# Patient Record
Sex: Female | Born: 1960 | Race: Black or African American | Hispanic: No | Smoking: Never smoker
Health system: Southern US, Community
[De-identification: ages and names within clinical notes are randomized; demographics above are authoritative.]

## PROBLEM LIST (undated history)

## (undated) DIAGNOSIS — E785 Hyperlipidemia, unspecified: Secondary | ICD-10-CM

## (undated) DIAGNOSIS — J45909 Unspecified asthma, uncomplicated: Secondary | ICD-10-CM

## (undated) DIAGNOSIS — J449 Chronic obstructive pulmonary disease, unspecified: Secondary | ICD-10-CM

## (undated) DIAGNOSIS — I10 Essential (primary) hypertension: Secondary | ICD-10-CM

## (undated) DIAGNOSIS — F32A Depression, unspecified: Secondary | ICD-10-CM

## (undated) HISTORY — PX: BLADDER SURGERY: SHX569

## (undated) HISTORY — DX: Unspecified asthma, uncomplicated: J45.909

## (undated) HISTORY — PX: ABDOMINAL HYSTERECTOMY: SHX81

---

## 2001-01-14 ENCOUNTER — Inpatient Hospital Stay (HOSPITAL_COMMUNITY): Admission: AD | Admit: 2001-01-14 | Discharge: 2001-01-14 | Payer: Self-pay | Admitting: *Deleted

## 2009-05-07 ENCOUNTER — Emergency Department (HOSPITAL_COMMUNITY): Admission: EM | Admit: 2009-05-07 | Discharge: 2009-05-08 | Payer: Self-pay | Admitting: Emergency Medicine

## 2010-06-24 LAB — URINALYSIS, ROUTINE W REFLEX MICROSCOPIC
Bilirubin Urine: NEGATIVE
Hgb urine dipstick: NEGATIVE
Nitrite: NEGATIVE
Protein, ur: NEGATIVE mg/dL
Specific Gravity, Urine: 1.029 (ref 1.005–1.030)
Urobilinogen, UA: 0.2 mg/dL (ref 0.0–1.0)

## 2010-06-24 LAB — GC/CHLAMYDIA PROBE AMP, GENITAL
Chlamydia, DNA Probe: NEGATIVE
GC Probe Amp, Genital: NEGATIVE

## 2010-06-24 LAB — WET PREP, GENITAL: Trich, Wet Prep: NONE SEEN

## 2010-06-24 LAB — RPR: RPR Ser Ql: NONREACTIVE

## 2013-12-31 ENCOUNTER — Emergency Department (HOSPITAL_COMMUNITY)
Admission: EM | Admit: 2013-12-31 | Discharge: 2013-12-31 | Disposition: A | Discharge status: Home / Self Care | Payer: Self-pay | Attending: Emergency Medicine | Admitting: Emergency Medicine

## 2013-12-31 ENCOUNTER — Encounter (HOSPITAL_COMMUNITY): Payer: Self-pay | Admitting: Emergency Medicine

## 2013-12-31 DIAGNOSIS — Z791 Long term (current) use of non-steroidal anti-inflammatories (NSAID): Secondary | ICD-10-CM | POA: Insufficient documentation

## 2013-12-31 DIAGNOSIS — M62838 Other muscle spasm: Secondary | ICD-10-CM | POA: Insufficient documentation

## 2013-12-31 DIAGNOSIS — M25519 Pain in unspecified shoulder: Secondary | ICD-10-CM | POA: Insufficient documentation

## 2013-12-31 DIAGNOSIS — I10 Essential (primary) hypertension: Secondary | ICD-10-CM | POA: Insufficient documentation

## 2013-12-31 DIAGNOSIS — Z79899 Other long term (current) drug therapy: Secondary | ICD-10-CM | POA: Insufficient documentation

## 2013-12-31 HISTORY — DX: Essential (primary) hypertension: I10

## 2013-12-31 MED ORDER — DIAZEPAM 5 MG PO TABS: 5.0000 mg | ORAL_TABLET | Freq: Three times a day (TID) | ORAL | Status: AC | PRN

## 2013-12-31 MED ORDER — NAPROXEN 500 MG PO TABS: 500.0000 mg | ORAL_TABLET | Freq: Two times a day (BID) | ORAL | Status: DC

## 2013-12-31 MED ORDER — DIAZEPAM 5 MG PO TABS
5.0000 mg | ORAL_TABLET | Freq: Once | ORAL | Status: AC
  Administered 2013-12-31: 5 mg via ORAL
  Filled 2013-12-31: qty 1

## 2013-12-31 MED ORDER — NAPROXEN 250 MG PO TABS
500.0000 mg | ORAL_TABLET | Freq: Once | ORAL | Status: AC
  Administered 2013-12-31: 500 mg via ORAL
  Filled 2013-12-31: qty 2
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
## 2013-12-31 NOTE — Discharge Instructions (Signed)
Please follow up with your primary care physician in 1-2 days. If you do not have one please call the [facility] number listed above. Please take pain medication and/or muscle relaxants as prescribed and as needed for pain. Please do not drive on narcotic pain medication or on muscle relaxants. Please follow RICE method normal. Please read all discharge instructions and return precautions.   Shoulder Pain The shoulder is the joint that connects your arms to your body. The bones that form the shoulder joint include the upper arm bone (humerus), the shoulder blade (scapula), and the collarbone (clavicle). The top of the humerus is shaped like a ball and fits into a rather flat socket on the scapula (glenoid cavity). A combination of muscles and strong, fibrous tissues that connect muscles to bones (tendons) support your shoulder joint and hold the ball in the socket. Small, fluid-filled sacs (bursae) are located in different areas of the joint. They act as cushions between the bones and the overlying soft tissues and help reduce friction between the gliding tendons and the bone as you move your arm. Your shoulder joint allows a wide range of motion in your arm. This range of motion allows you to do things like scratch your back or throw a ball. However, this range of motion also makes your shoulder more prone to pain from overuse and injury. Causes of shoulder pain can originate from both injury and overuse and usually can be grouped in the following four categories:  Redness, swelling, and pain (inflammation) of the tendon (tendinitis) or the bursae (bursitis).  Instability, such as a dislocation of the joint.  Inflammation of the joint (arthritis).  Broken bone (fracture). HOME CARE INSTRUCTIONS   Apply ice to the sore area.  Put ice in a plastic bag.  Place a towel between your skin and the bag.  Leave the ice on for 15-20 minutes, 3-4 times per day for the first 2 days, or  as directed by your health care provider.  Stop using cold packs if they do not help with the pain.  If you have a shoulder sling or immobilizer, wear it as long as your caregiver instructs. Only remove it to shower or bathe. Move your arm as little as possible, but keep your hand moving to prevent swelling.  Squeeze a soft ball or foam pad as much as possible to help prevent swelling.  Only take over-the-counter or prescription medicines for pain, discomfort, or fever as directed by your caregiver. SEEK MEDICAL CARE IF:   Your shoulder pain increases, or new pain develops in your arm, hand, or fingers.  Your hand or fingers become cold and numb.  Your pain is not relieved with medicines. SEEK IMMEDIATE MEDICAL CARE IF:   Your arm, hand, or fingers are numb or tingling.  Your arm, hand, or fingers are significantly swollen or turn white or blue. MAKE SURE YOU:   Understand these instructions.  Will watch your condition.  Will get help right away if you are not doing well or get worse. Document Released: 12/29/2004 Document Revised: 08/05/2013 Document Reviewed: 03/05/2011 [facility] Patient Information 2015 [place], [place]. This information is not intended to replace advice given to you by your health care provider. Make sure you discuss any questions you have with your health care provider.  RICE: Routine Care for Injuries The routine care of many injuries includes Rest, Ice, Compression, and Elevation (RICE). HOME CARE INSTRUCTIONS  Rest is needed to allow your body to  heal. Routine activities can usually be resumed when comfortable. Injured tendons and bones can take up to 6 weeks to heal. Tendons are the cord-like structures that attach muscle to bone.  Ice following an injury helps keep the swelling down and reduces pain.  Put ice in a plastic bag.  Place a towel between your skin and the bag.  Leave the ice on for 15-20 minutes, 3-4 times a day, or as directed by your  health care provider. Do this while awake, for the first 24 to 48 hours. After that, continue as directed by your caregiver.  Compression helps keep swelling down. It also gives support and helps with discomfort. If an elastic bandage has been applied, it should be removed and reapplied every 3 to 4 hours. It should not be applied tightly, but firmly enough to keep swelling down. Watch fingers or toes for swelling, bluish discoloration, coldness, numbness, or excessive pain. If any of these problems occur, remove the bandage and reapply loosely. Contact your caregiver if these problems continue.  Elevation helps reduce swelling and decreases pain. With extremities, such as the arms, hands, legs, and feet, the injured area should be placed near or above the level of the heart, if possible. SEEK IMMEDIATE MEDICAL CARE IF:  You have persistent pain and swelling.  You develop redness, numbness, or unexpected weakness.  Your symptoms are getting worse rather than improving after several days. These symptoms may indicate that further evaluation or further X-rays are needed. Sometimes, X-rays may not show a small broken bone (fracture) until 1 week or 10 days later. Make a follow-up appointment with your caregiver. Ask when your X-ray results will be ready. Make sure you get your X-ray results. Document Released: 07/03/2000 Document Revised: 03/26/2013 Document Reviewed: 08/20/2010 [facility] Patient Information 2015 [place]. This information is not intended to replace advice given to you by your health care provider. Make sure you discuss any questions you have with your health care provider.
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
## 2014-01-01 NOTE — ED Provider Notes (Signed)
Medical screening examination/treatment/procedure(s) were performed by non-physician practitioner and as supervising physician I was immediately available for consultation/collaboration.    Vida RollerBrian D Asriel Westrup, MD 01/01/14 [phone]

## 2018-02-10 ENCOUNTER — Emergency Department (HOSPITAL_COMMUNITY)
Admission: EM | Admit: 2018-02-10 | Discharge: 2018-02-10 | Disposition: A | Discharge status: Home / Self Care | Payer: 59 | Attending: Emergency Medicine | Admitting: Emergency Medicine

## 2018-02-10 ENCOUNTER — Other Ambulatory Visit: Payer: Self-pay

## 2018-02-10 ENCOUNTER — Encounter (HOSPITAL_COMMUNITY): Payer: Self-pay | Admitting: Emergency Medicine

## 2018-02-10 DIAGNOSIS — B373 Candidiasis of vulva and vagina: Secondary | ICD-10-CM | POA: Insufficient documentation

## 2018-02-10 DIAGNOSIS — B3731 Acute candidiasis of vulva and vagina: Secondary | ICD-10-CM

## 2018-02-10 DIAGNOSIS — Z79899 Other long term (current) drug therapy: Secondary | ICD-10-CM | POA: Insufficient documentation

## 2018-02-10 DIAGNOSIS — N898 Other specified noninflammatory disorders of vagina: Secondary | ICD-10-CM | POA: Diagnosis present

## 2018-02-10 DIAGNOSIS — I10 Essential (primary) hypertension: Secondary | ICD-10-CM | POA: Diagnosis not present

## 2018-02-10 LAB — WET PREP, GENITAL
Sperm: NONE SEEN
Yeast Wet Prep HPF POC: NONE SEEN

## 2018-02-10 MED ORDER — FLUCONAZOLE 150 MG PO TABS: ORAL_TABLET | ORAL | 0 refills | Status: DC

## 2018-02-10 NOTE — ED Notes (Signed)
Patient verbalizes understanding of discharge instructions. Opportunity for questioning and answers were provided. Armband removed by staff, pt discharged from ED ambulatory.   

## 2018-02-10 NOTE — ED Triage Notes (Signed)
Patient c/o vaginal, itching and burning. She was previously seen at an ED in [place] and was diagnosed with an STI. Was given antibiotics, did not finish all of it and she had a beer every night with the antibiotic.

## 2018-02-10 NOTE — ED Provider Notes (Signed)
[facility] EMERGENCY DEPARTMENT Provider Note   CSN: [id number] Arrival date & time: 02/10/18  1645     History   Chief Complaint Chief Complaint  Patient presents with  . Vaginal Discharge    HPI Nicole Heath is a 57 y.o. female.  The history is provided by the patient. No language interpreter was used.  Vaginal Discharge   This is a new problem. The current episode started more than 2 days ago. The problem occurs constantly. The problem has been gradually worsening. The discharge was normal. Associated symptoms include genital itching. She has tried nothing for the symptoms. The treatment provided no relief. Her past medical history is significant for STD.  Pt just finished rx for amoxicillin.  Pt had an injection of antibiotics for a std a week ago.   Past Medical History:  Diagnosis Date  . Hypertension     There are no active problems to display for this patient.   History reviewed. No pertinent surgical history.   OB History   None      Home Medications    Prior to Admission medications   Medication Sig Start Date End Date Taking? Authorizing Provider  diazepam (VALIUM) 5 MG tablet Take 1 tablet (5 mg total) by mouth every 8 (eight) hours as needed for muscle spasms. 12/31/13   Piepenbrink, Victorino Dike, PA-C  fluconazole (DIFLUCAN) 150 MG tablet Take one tablet now. Repeat in 3 days if needed. 02/10/18   Elson Areas, PA-C  metoprolol tartrate (LOPRESSOR) 25 MG tablet Take 25 mg by mouth daily.    [provider]  naproxen (NAPROSYN) 500 MG tablet Take 1 tablet (500 mg total) by mouth 2 (two) times daily with a meal. 12/31/13   Piepenbrink, Victorino Dike, PA-C  triamterene-hydrochlorothiazide (MAXZIDE-25) 37.5-25 MG per tablet Take 1 tablet by mouth daily.    [provider]    Family History History reviewed. No pertinent family history.  Social History Social History   Tobacco Use  . Smoking status: Never Smoker    Substance Use Topics  . Alcohol use: No  . Drug use: No     Allergies   Patient has no known allergies.   Review of Systems Review of Systems  Genitourinary: Positive for vaginal discharge.  All other systems reviewed and are negative.    Physical Exam Updated Vital Signs BP 136/78 (BP Location: Right Arm)   Pulse 72   Temp 97.6 F (36.4 C) (Oral)   Resp 16   Ht 5\' 3"  (1.6 m)   Wt 104.3 kg   SpO2 98%   BMI 40.74 kg/m   Physical Exam  Constitutional: She appears well-developed and well-nourished.  HENT:  Head: Normocephalic and atraumatic.  Eyes: Pupils are equal, round, and reactive to light. Conjunctivae are normal.  Neck: Normal range of motion. Neck supple.  Cardiovascular: Normal rate.  Abdominal: Soft. There is no tenderness.  Genitourinary: Vaginal discharge found.  Genitourinary Comments: Vaginal discharge,  Thick white,  Irritation,  Looks like yeast  Musculoskeletal: Normal range of motion.  Skin: Skin is warm.     ED Treatments / Results  Labs (all labs ordered are listed, but only abnormal results are displayed) Labs Reviewed  WET PREP, GENITAL  GC/CHLAMYDIA PROBE AMP ([place]) NOT AT [facility]    EKG None  Radiology No results found.  Procedures Procedures (including critical care time)  Medications Ordered in ED Medications - No data to display   Initial Impression / Assessment  and Plan / ED Course  I have reviewed the triage vital signs and the nursing notes.  Pertinent labs & imaging results that were available during my care of the patient were reviewed by me and considered in my medical decision making (see chart for details).     MDM  Pt recently on multiple antibiotics.  I suspect pt has a yeast infection   Final Clinical Impressions(s) / ED Diagnoses   Final diagnoses:  Vaginal yeast infection    ED Discharge Orders         Ordered    fluconazole (DIFLUCAN) 150 MG tablet     02/10/18 1725        An After  Visit Summary was printed and given to the patient.    Elson Areas, [place] 02/10/18 1737    Tegeler, Canary Brim, MD 02/10/18 [phone]

## 2018-02-12 LAB — GC/CHLAMYDIA PROBE AMP (~~LOC~~) NOT AT ARMC
CHLAMYDIA, DNA PROBE: NEGATIVE
Neisseria Gonorrhea: NEGATIVE

## 2021-04-15 ENCOUNTER — Emergency Department (HOSPITAL_COMMUNITY): Payer: Medicare Other

## 2021-04-15 ENCOUNTER — Encounter (HOSPITAL_COMMUNITY): Payer: Self-pay | Admitting: Emergency Medicine

## 2021-04-15 ENCOUNTER — Other Ambulatory Visit: Payer: Self-pay

## 2021-04-15 ENCOUNTER — Emergency Department (HOSPITAL_COMMUNITY)
Admission: EM | Admit: 2021-04-15 | Discharge: 2021-04-16 | Disposition: A | Discharge status: Home / Self Care | Payer: Medicare Other | Attending: Emergency Medicine | Admitting: Emergency Medicine

## 2021-04-15 DIAGNOSIS — H6122 Impacted cerumen, left ear: Secondary | ICD-10-CM | POA: Diagnosis not present

## 2021-04-15 DIAGNOSIS — X500XXA Overexertion from strenuous movement or load, initial encounter: Secondary | ICD-10-CM | POA: Insufficient documentation

## 2021-04-15 DIAGNOSIS — S46812A Strain of other muscles, fascia and tendons at shoulder and upper arm level, left arm, initial encounter: Secondary | ICD-10-CM | POA: Insufficient documentation

## 2021-04-15 DIAGNOSIS — S4992XA Unspecified injury of left shoulder and upper arm, initial encounter: Secondary | ICD-10-CM | POA: Diagnosis present

## 2021-04-15 HISTORY — DX: Hyperlipidemia, unspecified: E78.5

## 2021-04-15 HISTORY — DX: Essential (primary) hypertension: I10

## 2021-04-15 HISTORY — DX: Depression, unspecified: F32.A

## 2021-04-15 NOTE — ED Triage Notes (Signed)
Pt arrived POV from home c/o left arm pain x2 weeks. Pt denies any trauma or injury. Pt states the pain does go up into her neck on the left side.

## 2021-04-15 NOTE — ED Provider Triage Note (Signed)
Emergency Medicine Provider Triage Evaluation Note  RICHELLE GLICK , a 61 y.o. female  was evaluated in triage.  Pt complains of left shoulder pain.  She says she is had these pains for about 2 weeks now.  She expresses pain in her lateral side of her left neck.  It radiates all the way down to her left arm.  She does say at times she has intermittent tingling in her arm especially when she lays on it.  This is not constant and is not present at this time of this exam.  She has no motor weakness. She does not really remember any traumatic event.    Review of Systems  Positive:  Negative:   Physical Exam  BP (!) 157/85 (BP Location: Left Arm)    Pulse 76    Temp 98.8 F (37.1 C) (Oral)    Resp 16    Ht 5\' 3"  (1.6 m)    Wt 104.3 kg    SpO2 98%    BMI 40.74 kg/m  Gen:   Awake, no distress   Resp:  Normal effort  MSK:   Moves extremities without difficulty  Other:  No c midline ttp or stepoffs. Reproducible cervical muscular ttp. Full Rom of LUE. Pulses intact. Sensation intact  Medical Decision Making  Medically screening exam initiated at 4:34 PM.  Appropriate orders placed.  ALIJAH HYDE was informed that the remainder of the evaluation will be completed by another provider, this initial triage assessment does not replace that evaluation, and the importance of remaining in the ED until their evaluation is complete.     Frederich Balding, Claudie Leach 04/15/21 1635

## 2021-04-16 DIAGNOSIS — S46812A Strain of other muscles, fascia and tendons at shoulder and upper arm level, left arm, initial encounter: Secondary | ICD-10-CM | POA: Diagnosis not present

## 2021-04-16 MED ORDER — KETOROLAC TROMETHAMINE 15 MG/ML IJ SOLN
15.0000 mg | Freq: Once | INTRAMUSCULAR | Status: AC
  Administered 2021-04-16: 15 mg via INTRAMUSCULAR
  Filled 2021-04-16: qty 1

## 2021-04-16 MED ORDER — OXYCODONE HCL 5 MG PO TABS
5.0000 mg | ORAL_TABLET | Freq: Once | ORAL | Status: AC
Start: 2021-04-16 — End: 2021-04-16
  Administered 2021-04-16: 5 mg via ORAL
  Filled 2021-04-16: qty 1

## 2021-04-16 MED ORDER — ACETAMINOPHEN 500 MG PO TABS
1000.0000 mg | ORAL_TABLET | Freq: Once | ORAL | Status: AC
  Administered 2021-04-16: 1000 mg via ORAL
  Filled 2021-04-16: qty 2

## 2021-04-16 NOTE — ED Notes (Signed)
RN reviewed discharge instructions with pt. Pt verbalize understanding and had no further questions. VSS upon discharge 

## 2021-04-16 NOTE — Discharge Instructions (Signed)
Take 4 over the counter ibuprofen tablets 3 times a day or 2 over-the-counter naproxen tablets twice a day for pain. Also take tylenol 1000mg (2 extra strength) four times a day.   Sling is for your comfort only.  You should take your arm out of the sling at least 4 times a day and perform range of motion exercises.  I have given you information to try and call the sports medicine physician and see if he is able to see you in the office.  Try not to lift anything greater than 10 pounds or do repetitive shoulder motions with the left arm.

## 2021-04-16 NOTE — ED Provider Notes (Signed)
[facility] EMERGENCY DEPARTMENT Provider Note   CSN: [id number] Arrival date & time: 04/15/21  1528     History  Chief Complaint  Patient presents with   Arm Pain    Nicole Heath is a 61 y.o. female.  61 yo F with a chief complaint of left shoulder pain.  This been going on for the better part of 3 weeks.  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  She tells me that she has been lifting a lot of boxes and moving them from one place to another recently.  Denies trauma to the area.  She has been seen in 2 Emergency Department prior to this 1 according to her.  Feels like her pain continues to persist despite therapies at home.  The history is provided by the patient.  Arm Pain This is a new problem. The current episode started yesterday. The problem occurs constantly. The problem has not changed since onset.Pertinent negatives include no chest pain, no headaches and no shortness of breath. Nothing aggravates the symptoms. Nothing relieves the symptoms. She has tried nothing for the symptoms. The treatment provided no relief.      Home Medications Prior to Admission medications   Not on File      Allergies    Patient has no known allergies.    Review of Systems   Review of Systems  Constitutional:  Negative for chills and fever.  HENT:  Positive for ear pain. Negative for congestion and rhinorrhea.   Eyes:  Negative for redness and visual disturbance.  Respiratory:  Negative for shortness of breath and wheezing.   Cardiovascular:  Negative for chest pain and palpitations.  Gastrointestinal:  Negative for nausea and vomiting.  Genitourinary:  Negative for dysuria and urgency.  Musculoskeletal:  Positive for arthralgias. Negative for myalgias.  Skin:  Negative for pallor and wound.  Neurological:  Negative for dizziness and headaches.   Physical Exam Updated Vital Signs BP (!) 163/92 (BP Location: Right Arm)    Pulse 63    Temp  97.8 F (36.6 C) (Oral)    Resp 19    Ht 5\' 3"  (1.6 m)    Wt 104.3 kg    SpO2 97%    BMI 40.74 kg/m  Physical Exam Vitals and nursing note reviewed.  Constitutional:      General: She is not in acute distress.    Appearance: She is well-developed. She is not diaphoretic.  HENT:     Head: Normocephalic and atraumatic.     Ears:     Comments: Left wax impaction. Eyes:     Pupils: Pupils are equal, round, and reactive to light.  Cardiovascular:     Rate and Rhythm: Normal rate and regular rhythm.     Heart sounds: No murmur heard.   No friction rub. No gallop.  Pulmonary:     Effort: Pulmonary effort is normal.     Breath sounds: No wheezing or rales.  Abdominal:     General: There is no distension.     Palpations: Abdomen is soft.     Tenderness: There is no abdominal tenderness.  Musculoskeletal:        General: Tenderness present.     Cervical back: Normal range of motion and neck supple.     Comments: Pain along the left trapezius muscle belly.  She also has some pain intra-articularly in the left shoulder with forward flexion and lateral flexion.  Sits muscles  are weak somewhat diffusely.  Pulse motor and sensation intact distally.  Skin:    General: Skin is warm and dry.  Neurological:     Mental Status: She is alert and oriented to person, place, and time.  Psychiatric:        Behavior: Behavior normal.    ED Results / Procedures / Treatments   Labs (all labs ordered are listed, but only abnormal results are displayed) Labs Reviewed - No data to display  EKG EKG Interpretation  Date/Time:  Thursday April 15 2021 15:51:52 EST Ventricular Rate:  73 PR Interval:  158 QRS Duration: 94 QT Interval:  396 QTC Calculation: 436 R Axis:   24 Text Interpretation: Normal sinus rhythm ST & T wave abnormality, consider inferolateral ischemia Abnormal ECG No previous ECGs available No old tracing to compare Confirmed by Deno Etienne [id number]) on 04/16/2021 1:10:48  AM  Radiology DG Shoulder Left  Result Date: 04/15/2021 CLINICAL DATA:  Left shoulder pain EXAM: LEFT SHOULDER - 2+ VIEW COMPARISON:  None. FINDINGS: No acute fracture or dislocation. Moderate to severe glenohumeral joint osteoarthritis with joint space loss and bulky marginal osteophytes. Mild-moderate AC joint arthropathy. No soft tissue abnormality. IMPRESSION: Moderate to severe glenohumeral joint osteoarthritis. Electronically Signed   By: Davina Poke D.O.   On: 04/15/2021 17:01    Procedures Procedures    Medications Ordered in ED Medications  acetaminophen (TYLENOL) tablet 1,000 mg (1,000 mg Oral Given 04/16/21 0344)  ketorolac (TORADOL) 15 MG/ML injection 15 mg (15 mg Intramuscular Given 04/16/21 0344)  oxyCODONE (Oxy IR/ROXICODONE) immediate release tablet 5 mg (5 mg Oral Given 04/16/21 0344)    ED Course/ Medical Decision Making/ A&P                           Medical Decision Making  61 yo F with a chief complaints of left shoulder pain.  This been an ongoing issue for her.  Sounds like a muscular strain by history.  She does have some weakness to the rotator cuff though may be secondary to pain from trapezius strain.  Has obvious trapezius strain on exam.  Discussed with her about proper shoulder motions.  Will sling for comfort.  PCP follow-up.  3:55 AM:  I have discussed the diagnosis/risks/treatment options with the patient and believe the pt to be eligible for discharge home to follow-up with PCP. We also discussed returning to the ED immediately if new or worsening sx occur. We discussed the sx which are most concerning (e.g., sudden worsening pain, fever, inability to tolerate by mouth) that necessitate immediate return. Medications administered to the patient during their visit and any new prescriptions provided to the patient are listed below.  Medications given during this visit Medications  acetaminophen (TYLENOL) tablet 1,000 mg (1,000 mg Oral Given 04/16/21 0344)   ketorolac (TORADOL) 15 MG/ML injection 15 mg (15 mg Intramuscular Given 04/16/21 0344)  oxyCODONE (Oxy IR/ROXICODONE) immediate release tablet 5 mg (5 mg Oral Given 04/16/21 0344)     The patient appears reasonably screen and/or stabilized for discharge and I doubt any other medical condition or other [facility] requiring further screening, evaluation, or treatment in the ED at this time prior to discharge.          Final Clinical Impression(s) / ED Diagnoses Final diagnoses:  Trapezius strain, left, initial encounter  Impacted cerumen of left ear    Rx / DC Orders ED Discharge Orders     None  Deno Etienne, DO 04/16/21 [phone]

## 2021-04-19 ENCOUNTER — Ambulatory Visit: Payer: Medicare Other | Admitting: Family Medicine

## 2021-04-19 NOTE — Progress Notes (Deleted)
° ° °  Subjective:    CC: L shoulder pain  I, Gelsey Amyx, LAT, ATC, am serving as scribe for Dr. Clementeen Graham.  HPI: Pt is a 61 y/o female presenting w/ c/o L shoulder and L upper trap pain x approximately one month after recently moving a lot of boxes.  She has been seen 3x at the ED for these c/o w/ the most recent being on 04/15/21.  She had a Toradol IM injection on 04/15/21 at the [facility] ED and was given a single dose of oxycodone.  Today, pt reports  L shoulder mechanical symptoms: Aggravating factors: Treatments tried:  Diagnostic testing: L shoulder XR- 04/15/21  Pertinent review of Systems: ***  Relevant historical information: ***   Objective:   There were no vitals filed for this visit. General: Well Developed, well nourished, and in no acute distress.   MSK: ***  Lab and Radiology Results No results found for this or any previous visit (from the past 72 hour(s)). DG Shoulder Left  Result Date: 04/15/2021 CLINICAL DATA:  Left shoulder pain EXAM: LEFT SHOULDER - 2+ VIEW COMPARISON:  None. FINDINGS: No acute fracture or dislocation. Moderate to severe glenohumeral joint osteoarthritis with joint space loss and bulky marginal osteophytes. Mild-moderate AC joint arthropathy. No soft tissue abnormality. IMPRESSION: Moderate to severe glenohumeral joint osteoarthritis. Electronically Signed   By: Duanne Guess D.O.   On: 04/15/2021 17:01      Impression and Recommendations:    Assessment and Plan: 61 y.o. female with ***.  PDMP not reviewed this encounter. No orders of the defined types were placed in this encounter.  No orders of the defined types were placed in this encounter.   Discussed warning signs or symptoms. Please see discharge instructions. Patient expresses understanding.   ***

## 2021-08-03 ENCOUNTER — Ambulatory Visit (INDEPENDENT_AMBULATORY_CARE_PROVIDER_SITE_OTHER): Payer: Medicare Other | Admitting: Obstetrics

## 2021-08-03 ENCOUNTER — Encounter: Payer: Self-pay | Admitting: Obstetrics

## 2021-08-03 ENCOUNTER — Other Ambulatory Visit (HOSPITAL_COMMUNITY)
Admission: RE | Admit: 2021-08-03 | Discharge: 2021-08-03 | Disposition: A | Discharge status: Home / Self Care | Payer: Medicare Other | Source: Ambulatory Visit | Attending: Obstetrics | Admitting: Obstetrics

## 2021-08-03 VITALS — BP 158/89 | HR 61 | Ht 63.0 in | Wt 226.5 lb

## 2021-08-03 DIAGNOSIS — F419 Anxiety disorder, unspecified: Secondary | ICD-10-CM

## 2021-08-03 DIAGNOSIS — N898 Other specified noninflammatory disorders of vagina: Secondary | ICD-10-CM | POA: Diagnosis not present

## 2021-08-03 DIAGNOSIS — N393 Stress incontinence (female) (male): Secondary | ICD-10-CM

## 2021-08-03 DIAGNOSIS — Z90710 Acquired absence of both cervix and uterus: Secondary | ICD-10-CM | POA: Diagnosis not present

## 2021-08-03 DIAGNOSIS — Z01419 Encounter for gynecological examination (general) (routine) without abnormal findings: Secondary | ICD-10-CM | POA: Diagnosis not present

## 2021-08-03 DIAGNOSIS — Z1239 Encounter for other screening for malignant neoplasm of breast: Secondary | ICD-10-CM

## 2021-08-03 DIAGNOSIS — Z113 Encounter for screening for infections with a predominantly sexual mode of transmission: Secondary | ICD-10-CM

## 2021-08-03 DIAGNOSIS — Z1211 Encounter for screening for malignant neoplasm of colon: Secondary | ICD-10-CM

## 2021-08-03 DIAGNOSIS — F32A Depression, unspecified: Secondary | ICD-10-CM

## 2021-08-03 DIAGNOSIS — R1030 Lower abdominal pain, unspecified: Secondary | ICD-10-CM | POA: Diagnosis not present

## 2021-08-03 LAB — POCT URINALYSIS DIPSTICK
Bilirubin, UA: NEGATIVE
Glucose, UA: NEGATIVE
Ketones, UA: NEGATIVE
Nitrite, UA: NEGATIVE
Protein, UA: NEGATIVE
Spec Grav, UA: 1.025 (ref 1.010–1.025)
Urobilinogen, UA: 0.2 E.U./dL
pH, UA: 5 (ref 5.0–8.0)

## 2021-08-03 NOTE — Progress Notes (Signed)
? ?Subjective: ? ? ?  ?  ? Nicole Heath is a 61 y.o. female here for a routine exam.  Current complaints: Vaginal discharge.   ? ?Personal health questionnaire:  ?Is patient Ashkenazi Jewish, have a family history of breast and/or ovarian cancer: no ?Is there a family history of uterine cancer diagnosed at age < 5750, gastrointestinal cancer, urinary tract cancer, family member who is a Personnel officerLynch syndrome-associated carrier: yes ?Is the patient overweight and hypertensive, family history of diabetes, personal history of gestational diabetes, preeclampsia or PCOS: yes ?Is patient over 6655, have PCOS,  family history of premature CHD under age 61, diabetes, smoke, have hypertension or peripheral artery disease:  no ?At any time, has a partner hit, kicked or otherwise hurt or frightened you?: no ?Over the past 2 weeks, have you felt down, depressed or hopeless?: no ?Over the past 2 weeks, have you felt little interest or pleasure in doing things?:no ? ? ?Gynecologic History ?No LMP recorded. Patient has had a hysterectomy. ?Contraception: status post hysterectomy ?Last Pap: unknown. Results were: unknown ?Last mammogram: a few years ago. Results were: normal ? ?Obstetric History ?OB History  ?Gravida Para Term Preterm AB Living  ?4 3 3  0 1 3  ?SAB IAB Ectopic Multiple Live Births  ?0 1 0 0 3  ?  ?# Outcome Date GA Lbr Len/2nd Weight Sex Delivery Anes PTL Lv  ?4 IAB           ?3 Term           ?2 Term           ?1 Term           ? ? ?Past Medical History:  ?Diagnosis Date  ? Asthma   ? Depression   ? Hyperlipidemia   ? Hypertension   ?  ?Past Surgical History:  ?Procedure Laterality Date  ? ABDOMINAL HYSTERECTOMY    ? partial, has ovaries  ? BLADDER SURGERY    ? "bladder tack"  ?  ? ?Current Outpatient Medications:  ?  albuterol (ACCUNEB) 1.25 MG/3ML nebulizer solution, Inhale 1 ampule into the lungs daily as needed., Disp: , Rfl:  ?  amLODipine (NORVASC) 5 MG tablet, Take 1 tablet by mouth daily., Disp: , Rfl:  ?   ARIPiprazole (ABILIFY) 10 MG tablet, Take 1 tablet by mouth daily., Disp: , Rfl:  ?  atorvastatin (LIPITOR) 20 MG tablet, Take 1 tablet by mouth daily., Disp: , Rfl:  ?  diazepam (VALIUM) 5 MG tablet, Take 1 tablet (5 mg total) by mouth every 8 (eight) hours as needed for muscle spasms. (Patient not taking: Reported on 08/03/2021), Disp: 10 tablet, Rfl: 0 ?  fluconazole (DIFLUCAN) 150 MG tablet, Take one tablet now. Repeat in 3 days if needed. (Patient not taking: Reported on 08/03/2021), Disp: 2 tablet, Rfl: 0 ?  metoprolol tartrate (LOPRESSOR) 25 MG tablet, Take 25 mg by mouth daily. (Patient not taking: Reported on 08/03/2021), Disp: , Rfl:  ?  naproxen (NAPROSYN) 500 MG tablet, Take 1 tablet (500 mg total) by mouth 2 (two) times daily with a meal. (Patient not taking: Reported on 08/03/2021), Disp: 30 tablet, Rfl: 0 ?  triamterene-hydrochlorothiazide (MAXZIDE-25) 37.5-25 MG per tablet, Take 1 tablet by mouth daily. (Patient not taking: Reported on 08/03/2021), Disp: , Rfl:  ?No Known Allergies  ?Social History  ? ?Tobacco Use  ? Smoking status: Never  ? Smokeless tobacco: Never  ?Substance Use Topics  ? Alcohol use: No  ?  ?  Family History  ?Problem Relation Age of Onset  ? Hypertension Maternal Grandmother   ? Hypertension Father   ? Hypertension Mother   ? Colon cancer Mother   ?  ? ? ?Review of Systems ? ?Constitutional: negative for fatigue and weight loss ?Respiratory: negative for cough and wheezing ?Cardiovascular: negative for chest pain, fatigue and palpitations ?Gastrointestinal: negative for abdominal pain and change in bowel habits ?Musculoskeletal:negative for myalgias ?Neurological: negative for gait problems and tremors ?Behavioral/Psych: negative for abusive relationship, depression ?Endocrine: negative for temperature intolerance    ?Genitourinary: positive for vaginal discharge and SUI.   negative for abnormal menstrual periods, genital lesions, hot flashes, sexual problems  ?Integument/breast: negative  for breast lump, breast tenderness, nipple discharge and skin lesion(s) ? ?  ?Objective:  ? ?    ?BP (!) 158/89   Pulse 61   Ht 5\' 3"  (1.6 m)   Wt 226 lb 8 oz (102.7 kg)   BMI 40.12 kg/m?  ?General:   Alert and no distress  ?Skin:   no rash or abnormalities  ?Lungs:   clear to auscultation bilaterally  ?Heart:   regular rate and rhythm, S1, S2 normal, no murmur, click, rub or gallop  ?Breasts:   normal without suspicious masses, skin or nipple changes or axillary nodes  ?Abdomen:  normal findings: no organomegaly, soft, non-tender and no hernia  ?Pelvis:  External genitalia: normal general appearance ?Urinary system: urethral meatus normal and bladder without fullness, nontender ?Vaginal: normal without tenderness, induration or masses ?Cervix: absent ?Adnexa: normal bimanual exam ?Uterus: absent  ? ?Lab Review ?Urine pregnancy test ?Labs reviewed yes ?Radiologic studies reviewed yes ? ?I have spent a total of 20 minutes of face-to-face time, excluding clinical staff time, reviewing notes and preparing to see patient, ordering tests and/or medications, and counseling the patient.  ? ?Assessment:  ? ? 1. Encounter for annual routine gynecological examination ? ?2. H/O: hysterectomy for AUB ? ?3. SUI (stress urinary incontinence, female) ?- had bladder repair after hysterectomy ?- has appointment with Urology for follow up ? ?4. Vaginal discharge ?Rx: ?- Cervicovaginal ancillary only ? ?5. Screening for STD (sexually transmitted disease) ?Rx: ?- HIV Antibody (routine testing w rflx) ?- Hepatitis B surface antigen ?- RPR ?- Hepatitis C antibody ? ?6. Lower abdominal pain ?Rx: ?- POCT Urinalysis Dipstick ?- Urine Culture ? ?7. Screening breast examination ?Rx: ?- MM 3D SCREEN BREAST BILATERAL; Future ? ?8. Screening for colon cancer ?Rx: ?- Ambulatory referral to Gastroenterology ? ?9. Anxiety and depression ?- goes to [facility] in [place] for care ?  ?  ?Plan:  ? ? Education reviewed: calcium supplements, depression  evaluation, low fat, low cholesterol diet, safe sex/STD prevention, self breast exams, skin cancer screening, and weight bearing exercise. ?Mammogram ordered. ?Follow up in: 1 year.  ? ? ?Orders Placed This Encounter  ?Procedures  ? Urine Culture  ? MM 3D SCREEN BREAST BILATERAL  ?  INS:[facility] MCR MCD  ?ANNUAL// NO PROBLEMS// NO HX OF BREAST CANCER  ?PREV: 2020 @ [facility]  ?NO NEEDS ?AJ SW TOTITANNA @ [facility]  ?PT AWARE $75 NO SHOW/CANCELLATION FEE WITHIN 24 HOURS  ?* PT AWARE TO SIGN RELEASE FORM*  ?  Standing Status:   Future  ?  Standing Expiration Date:   08/03/2022  ?  Order Specific Question:   Reason for Exam (SYMPTOM  OR DIAGNOSIS REQUIRED)  ?  Answer:   Screening  ?  Order Specific Question:   Is the patient pregnant?  ?  Answer:   No  ?  Order Specific Question:   Preferred imaging location?  ?  Answer:   [facility]  ? HIV Antibody (routine testing w rflx)  ? Hepatitis B surface antigen  ? RPR  ? Hepatitis C antibody  ? Ambulatory referral to Gastroenterology  ?  Referral Priority:   Routine  ?  Referral Type:   Consultation  ?  Referral Reason:   Specialty Services Required  ?  Number of Visits Requested:   1  ? POCT Urinalysis Dipstick  ? ? ? Brock Bad, MD ?08/03/2021 1:16 PM  ?

## 2021-08-03 NOTE — Progress Notes (Signed)
New pt/ establish care ?Reports low abdomen pain and back pain ?Needs PCP referral ? ?Depression and anxiety screen positive. Goes to [facility] in [place] for mental health care. ? ?BP high today, did not take meds ?

## 2021-08-04 ENCOUNTER — Other Ambulatory Visit: Payer: Self-pay | Admitting: Obstetrics

## 2021-08-04 DIAGNOSIS — R768 Other specified abnormal immunological findings in serum: Secondary | ICD-10-CM

## 2021-08-04 LAB — CERVICOVAGINAL ANCILLARY ONLY
Bacterial Vaginitis (gardnerella): NEGATIVE
Candida Glabrata: POSITIVE — AB
Candida Vaginitis: NEGATIVE
Comment: NEGATIVE
Comment: NEGATIVE
Comment: NEGATIVE
Comment: NEGATIVE
Trichomonas: NEGATIVE

## 2021-08-04 LAB — HIV ANTIBODY (ROUTINE TESTING W REFLEX): HIV Screen 4th Generation wRfx: NONREACTIVE

## 2021-08-04 LAB — HEPATITIS C ANTIBODY: Hep C Virus Ab: REACTIVE — AB

## 2021-08-04 LAB — RPR: RPR Ser Ql: NONREACTIVE

## 2021-08-04 LAB — HEPATITIS B SURFACE ANTIGEN: Hepatitis B Surface Ag: NEGATIVE

## 2021-08-05 ENCOUNTER — Ambulatory Visit
Admission: RE | Admit: 2021-08-05 | Discharge: 2021-08-05 | Disposition: A | Discharge status: Home / Self Care | Payer: Medicare Other | Source: Ambulatory Visit | Attending: Obstetrics | Admitting: Obstetrics

## 2021-08-05 DIAGNOSIS — Z1239 Encounter for other screening for malignant neoplasm of breast: Secondary | ICD-10-CM

## 2021-08-06 LAB — URINE CULTURE

## 2021-08-07 ENCOUNTER — Other Ambulatory Visit: Payer: Self-pay | Admitting: Obstetrics

## 2021-08-07 DIAGNOSIS — B379 Candidiasis, unspecified: Secondary | ICD-10-CM

## 2021-08-07 MED ORDER — AZO BORIC ACID 600 MG VA SUPP: 1.0000 | Freq: Every day | VAGINAL | 0 refills | Status: DC

## 2021-08-09 ENCOUNTER — Telehealth: Payer: Self-pay

## 2021-08-09 ENCOUNTER — Ambulatory Visit (INDEPENDENT_AMBULATORY_CARE_PROVIDER_SITE_OTHER): Payer: Medicare Other | Admitting: Plastic Surgery

## 2021-08-09 VITALS — BP 185/87 | HR 61 | Ht 63.0 in | Wt 226.8 lb

## 2021-08-09 DIAGNOSIS — M793 Panniculitis, unspecified: Secondary | ICD-10-CM

## 2021-08-09 LAB — HCV RNA QUANT: Hepatitis C Quantitation: NOT DETECTED IU/mL

## 2021-08-09 LAB — SPECIMEN STATUS REPORT

## 2021-08-09 NOTE — Telephone Encounter (Signed)
Called to advise of results and rx, no answer, left vm ?

## 2021-08-11 NOTE — Progress Notes (Signed)
? ?  Referring Provider ?No referring provider defined for this encounter.  ? ?CC:  ?Abdomen improvement ? ?Nicole Heath is an 61 y.o. female.  ?HPI: Patient is 58-year-old who desires improvement of her abdomen.  She has a history of hysterectomy in 2004 and also bladder tacking.  She is actively losing weight currently and is currently 226 pounds.  Her BMI calculates to 40.8 today.  She is a non-smoker and does not use any tobacco products.  She does have significant back pain.  She is not on any blood thinners.  Patient has a history of hepatitis C but states that she was treated. ? ? ? ?No Known Allergies ? ?Outpatient Encounter Medications as of 08/09/2021  ?Medication Sig  ? albuterol (ACCUNEB) 1.25 MG/3ML nebulizer solution Inhale 1 ampule into the lungs daily as needed.  ? amLODipine (NORVASC) 5 MG tablet Take 1 tablet by mouth daily.  ? ARIPiprazole (ABILIFY) 10 MG tablet Take 1 tablet by mouth daily.  ? atorvastatin (LIPITOR) 20 MG tablet Take 1 tablet by mouth daily.  ? Boric Acid Vaginal (AZO BORIC ACID) 600 MG SUPP Place 1 suppository vaginally at bedtime.  ? diazepam (VALIUM) 5 MG tablet Take 1 tablet (5 mg total) by mouth every 8 (eight) hours as needed for muscle spasms.  ? fluconazole (DIFLUCAN) 150 MG tablet Take one tablet now. Repeat in 3 days if needed.  ? metoprolol tartrate (LOPRESSOR) 25 MG tablet Take 25 mg by mouth daily.  ? naproxen (NAPROSYN) 500 MG tablet Take 1 tablet (500 mg total) by mouth 2 (two) times daily with a meal.  ? triamterene-hydrochlorothiazide (MAXZIDE-25) 37.5-25 MG per tablet Take 1 tablet by mouth daily.  ? ?No facility-administered encounter medications on file as of 08/09/2021.  ?  ? ?Past Medical History:  ?Diagnosis Date  ? Asthma   ? Depression   ? Hyperlipidemia   ? Hypertension   ? ? ?Past Surgical History:  ?Procedure Laterality Date  ? ABDOMINAL HYSTERECTOMY    ? partial, has ovaries  ? BLADDER SURGERY    ? "bladder tack"  ? ? ?Family History  ?Problem  Relation Age of Onset  ? Hypertension Maternal Grandmother   ? Hypertension Father   ? Hypertension Mother   ? Colon cancer Mother   ? ? ?Social History  ? ?Social History Narrative  ? ** Merged History Encounter **  ?    ?  ? ?Review of Systems ?General: Denies fevers, chills, weight loss ?CV: Denies chest pain, shortness of breath, palpitations ? ? ?Physical Exam ? ?  08/09/2021  ? 10:57 AM 08/03/2021  ? 10:01 AM 04/16/2021  ?  4:01 AM  ?Vitals with BMI  ?Height 5\' 3"  5\' 3"    ?Weight 226 lbs 13 oz 226 lbs 8 oz   ?BMI 40.19 40.13   ?Systolic 185 158  ?Diastolic 87 89 101  ?Pulse 61 61 60  ?  ?General:  No acute distress,  Alert and oriented, Non-Toxic, Normal speech and affect ?Abdomen: Some excess above and below the umbilicus along with some intra-abdominal adipose ? ?Assessment/Plan ?Patient would likely be a good candidate for abdominoplasty but I recommend additional weight loss prior to proceeding with surgery to get her best result. ? ? ? ? ?08/11/2021, 12:10 PM  ? ? ? ? ? ?

## 2021-08-26 ENCOUNTER — Telehealth: Payer: Self-pay

## 2021-08-26 NOTE — Telephone Encounter (Signed)
Mailed quote for abdominoplastay $8,250. Patient did not have Mychart.

## 2021-10-12 ENCOUNTER — Telehealth: Payer: Self-pay

## 2021-10-12 NOTE — Telephone Encounter (Addendum)
Error

## 2021-11-08 ENCOUNTER — Ambulatory Visit (INDEPENDENT_AMBULATORY_CARE_PROVIDER_SITE_OTHER): Payer: Medicare Other | Admitting: Plastic Surgery

## 2021-11-08 ENCOUNTER — Encounter: Payer: Self-pay | Admitting: Plastic Surgery

## 2021-11-08 VITALS — Wt 228.6 lb

## 2021-11-08 DIAGNOSIS — R21 Rash and other nonspecific skin eruption: Secondary | ICD-10-CM

## 2021-11-08 DIAGNOSIS — M793 Panniculitis, unspecified: Secondary | ICD-10-CM | POA: Diagnosis not present

## 2021-11-08 NOTE — Progress Notes (Signed)
   Referring Provider No referring provider defined for this encounter.   CC:  Abdominal pannus  Nicole Heath is an 61 y.o. female.  HPI: Patient is a 61 year old with history of abdominal pannus.  She has recently had a visit with her orthopedic surgeon and he noted that he thought that her abdominal pannus may be contributing to her back pain.  This is noted in his 10/04/2021 note.  She also continues to have rashes under her pannus.  We have discussed abdominoplasty in the past if the patient lost more weight but given that her weight has been stable we discussed proceeding with panniculectomy to help with her back pain.  Review of Systems General: No fevers or chills  Physical Exam    11/08/2021   10:44 AM 08/09/2021   10:57 AM 08/03/2021   10:01 AM  Vitals with BMI  Height  5\' 3"  5\' 3"   Weight 228 lbs 10 oz 226 lbs 13 oz 226 lbs 8 oz  BMI  40.19 40.13  Systolic  185 158  Diastolic  87 89  Pulse  61 61    General:  No acute distress,  Alert and oriented, Non-Toxic, Normal speech and affect Abdomen: Significant abdominal pannus with abdominal pannus hanging below the pubic symphysis.  Evidence of hyperpigmentation from rash.  Assessment/Plan Given the patient's current weight and some upper abdominal fullness I think her best option would be to proceed with panniculectomy and consider abdominoplasty at a later date.  She is in agreement with this plan.  She is aware that there is a good chance that removing her pannus will help with her back pain and her rashes but there is not a guarantee.  11/08/2021, 12:42 PM

## 2021-11-24 ENCOUNTER — Telehealth: Payer: Self-pay

## 2021-11-29 ENCOUNTER — Telehealth: Payer: Self-pay | Admitting: *Deleted

## 2021-11-29 NOTE — Telephone Encounter (Signed)
Attempted to contact patient to schedule surgery but her voicemail is full. No other numbers listed on the patient demographics sheet.

## 2021-12-13 ENCOUNTER — Encounter (HOSPITAL_BASED_OUTPATIENT_CLINIC_OR_DEPARTMENT_OTHER): Payer: Self-pay | Admitting: Plastic Surgery

## 2021-12-13 ENCOUNTER — Ambulatory Visit: Payer: Medicare Other | Admitting: Surgical

## 2021-12-13 NOTE — Progress Notes (Unsigned)
Patient ID: Nicole Heath, female    DOB: December 02, 1960, 61 y.o.   MRN: [id number]  No chief complaint on file.     ICD-10-CM   1. Panniculitis  M79.3        History of Present Illness: Nicole Heath is a 61 y.o.  female  with a history of panniculitis.  She presents for preoperative evaluation for upcoming procedure, panniculectomy, scheduled for 12/21/2021 with Dr. Domenica Reamer.  The patient {HAS HAS [id number]} had problems with anesthesia. ***  Summary of Previous Visit: Non-smoker, does not use tobacco products.  She is not on blood thinners, history of hysterectomy in 2004 with bladder tacking.  Prior history of hepatitis C but reports this was treated.  Job: ***  PMH Significant for: Hypertension, hyperlipidemia, asthma   Past Medical History: Allergies: No Known Allergies  Current Medications:  Current Outpatient Medications:    albuterol (ACCUNEB) 1.25 MG/3ML nebulizer solution, Inhale 1 ampule into the lungs daily as needed., Disp: , Rfl:    amLODipine (NORVASC) 5 MG tablet, Take 1 tablet by mouth daily., Disp: , Rfl:    ARIPiprazole (ABILIFY) 10 MG tablet, Take 1 tablet by mouth daily., Disp: , Rfl:    atorvastatin (LIPITOR) 20 MG tablet, Take 1 tablet by mouth daily., Disp: , Rfl:    diazepam (VALIUM) 5 MG tablet, Take 1 tablet (5 mg total) by mouth every 8 (eight) hours as needed for muscle spasms., Disp: 10 tablet, Rfl: 0   fluconazole (DIFLUCAN) 150 MG tablet, Take one tablet now. Repeat in 3 days if needed., Disp: 2 tablet, Rfl: 0   metoprolol tartrate (LOPRESSOR) 25 MG tablet, Take 25 mg by mouth daily., Disp: , Rfl:    naproxen (NAPROSYN) 500 MG tablet, Take 1 tablet (500 mg total) by mouth 2 (two) times daily with a meal., Disp: 30 tablet, Rfl: 0   triamterene-hydrochlorothiazide (MAXZIDE-25) 37.5-25 MG per tablet, Take 1 tablet by mouth daily., Disp: , Rfl:   Past Medical Problems: Past Medical History:  Diagnosis Date   Asthma    Depression     Hyperlipidemia    Hypertension     Past Surgical History: Past Surgical History:  Procedure Laterality Date   ABDOMINAL HYSTERECTOMY     partial, has ovaries   BLADDER SURGERY     "bladder tack"    Social History: Social History   Socioeconomic History   Marital status: Divorced    Spouse name: Not on file   Number of children: Not on file   Years of education: Not on file   Highest education level: Not on file  Occupational History   Not on file  Tobacco Use   Smoking status: Never   Smokeless tobacco: Never  Substance and Sexual Activity   Alcohol use: No   Drug use: No   Sexual activity: Not Currently  Other Topics Concern   Not on file  Social History Narrative   ** Merged History Encounter **       Social Determinants of Health   Financial Resource Strain: Not on file  Food Insecurity: Not on file  Transportation Needs: Not on file  Physical Activity: Not on file  Stress: Not on file  Social Connections: Not on file  Intimate Partner Violence: Not on file    Family History: Family History  Problem Relation Age of Onset   Hypertension Maternal Grandmother    Hypertension Father    Hypertension Mother    Colon cancer Mother  Review of Systems: ROS  Physical Exam: Vital Signs There were no vitals taken for this visit.  Physical Exam *** Constitutional:      General: Not in acute distress.    Appearance: Normal appearance. Not ill-appearing.  HENT:     Head: Normocephalic and atraumatic.  Eyes:     Pupils: Pupils are equal, round Neck:     Musculoskeletal: Normal range of motion.  Cardiovascular:     Rate and Rhythm: Normal rate    Pulses: Normal pulses.  Pulmonary:     Effort: Pulmonary effort is normal. No respiratory distress.  Abdominal:     General: Abdomen is flat. There is no distension.  Musculoskeletal: Normal range of motion.  Skin:    General: Skin is warm and dry.     Findings: No erythema or rash.  Neurological:      General: No focal deficit present.     Mental Status: Alert and oriented to person, place, and time. Mental status is at baseline.     Motor: No weakness.  Psychiatric:        Mood and Affect: Mood normal.        Behavior: Behavior normal.    Assessment/Plan: The patient is scheduled for *** with Dr. Jenean Lindau.  Risks, benefits, and alternatives of procedure discussed, questions answered and consent obtained.    Smoking Status: ***; Counseling Given? *** Last Mammogram: ***; Results: ***  Caprini Score: ***; Risk Factors include: ***, BMI *** 25, and length of planned surgery. Recommendation for mechanical *** prophylaxis. Encourage early ambulation.   Pictures obtained: @consult ***  Post-op Rx sent to pharmacy: {Blank:[id number]::"Oxycodone, Zofran, Keflex","Oxycodone, Zofran"}  Patient was provided with the *** General Surgical Risk consent document and Pain Medication Agreement prior to their appointment.  They had adequate time to read through the risk consent documents and Pain Medication Agreement. We also discussed them in person together during this preop appointment. All of their questions were answered to their satisfaction.  Recommended calling if they have any further questions.  Risk consent form and Pain Medication Agreement to be scanned into patient's chart.  ***   Electronically signed by: Braydee Shimkus, PA-C 12/13/2021 1:26 PM

## 2021-12-16 ENCOUNTER — Encounter: Payer: Self-pay | Admitting: Surgical

## 2021-12-16 ENCOUNTER — Ambulatory Visit (INDEPENDENT_AMBULATORY_CARE_PROVIDER_SITE_OTHER): Payer: Medicare Other | Admitting: Surgical

## 2021-12-16 ENCOUNTER — Encounter (HOSPITAL_BASED_OUTPATIENT_CLINIC_OR_DEPARTMENT_OTHER)
Admission: RE | Admit: 2021-12-16 | Discharge: 2021-12-16 | Disposition: A | Discharge status: Home / Self Care | Payer: Medicare Other | Source: Ambulatory Visit | Attending: Plastic Surgery | Admitting: Plastic Surgery

## 2021-12-16 VITALS — BP 151/70 | HR 68 | Wt 230.4 lb

## 2021-12-16 DIAGNOSIS — I10 Essential (primary) hypertension: Secondary | ICD-10-CM | POA: Diagnosis not present

## 2021-12-16 DIAGNOSIS — Z01812 Encounter for preprocedural laboratory examination: Secondary | ICD-10-CM | POA: Diagnosis present

## 2021-12-16 DIAGNOSIS — M793 Panniculitis, unspecified: Secondary | ICD-10-CM

## 2021-12-16 LAB — BASIC METABOLIC PANEL
Anion gap: 9 (ref 5–15)
BUN: 14 mg/dL (ref 8–23)
CO2: 25 mmol/L (ref 22–32)
Calcium: 9.6 mg/dL (ref 8.9–10.3)
Chloride: 106 mmol/L (ref 98–111)
Creatinine, Ser: 0.93 mg/dL (ref 0.44–1.00)
GFR, Estimated: >60 mL/min (ref >60)
Glucose, Bld: 111 mg/dL — ABNORMAL HIGH (ref 70–99)
Potassium: 4.2 mmol/L (ref 3.5–5.1)
Sodium: 140 mmol/L (ref 135–145)

## 2021-12-16 MED ORDER — ONDANSETRON HCL 4 MG PO TABS: 4.0000 mg | ORAL_TABLET | Freq: Three times a day (TID) | ORAL | 0 refills | Status: DC | PRN

## 2021-12-16 MED ORDER — OXYCODONE HCL 5 MG PO TABS: 5.0000 mg | ORAL_TABLET | Freq: Four times a day (QID) | ORAL | 0 refills | Status: AC | PRN

## 2021-12-16 NOTE — Progress Notes (Signed)
Patient given CH[facility] presurgical soap and education provided. Patient verbalized understanding.

## 2021-12-16 NOTE — Progress Notes (Signed)
Patient ID: Nicole Heath, female    DOB: Aug 18, 1960, 61 y.o.   MRN: [id number]  Chief Complaint  Patient presents with   Pre-op Exam      ICD-10-CM   1. Panniculitis  M79.3       History of Present Illness: Nicole Bible Heath is a 61 y.o.  female  with a history of panniculitis.  She presents for preoperative evaluation for upcoming procedure, panniculectomy, scheduled for 12/21/2021 with Dr. Domenica Reamer.  The patient has not had problems with anesthesia. No history of DVT/PE.  No family history of DVT/PE.  No family or personal history of bleeding or clotting disorders.  Patient is not currently taking any blood thinners.  No history of CVA/MI.   Summary of Previous Visit: Non-smoker, does not use tobacco products.  She is not on blood thinners.  She has a history of hysterectomy in 2004 with bladder tacking.  Prior history of hepatitis C, but reports this was treated.  PMH Significant for: Hypertension, hyperlipidemia, asthma/copd  Patient reports her asthma has been well controlled, reports she only uses her albuterol as needed.  She reports that she is no longer taking her Abilify for bipolar disorder, reports she stopped this because she noted that it caused weight gain.  She denies any changes in her health recently, denies any cardiac or pulmonary disease   Past Medical History: Allergies: No Known Allergies  Current Medications:  Current Outpatient Medications:    albuterol (ACCUNEB) 1.25 MG/3ML nebulizer solution, Inhale 1 ampule into the lungs daily as needed., Disp: , Rfl:    amLODipine (NORVASC) 5 MG tablet, Take 1 tablet by mouth daily., Disp: , Rfl:    ARIPiprazole (ABILIFY) 10 MG tablet, Take 1 tablet by mouth daily., Disp: , Rfl:    atorvastatin (LIPITOR) 20 MG tablet, Take 1 tablet by mouth daily., Disp: , Rfl:    diazepam (VALIUM) 5 MG tablet, Take 1 tablet (5 mg total) by mouth every 8 (eight) hours as needed for muscle spasms., Disp: 10 tablet, Rfl: 0    fluconazole (DIFLUCAN) 150 MG tablet, Take one tablet now. Repeat in 3 days if needed., Disp: 2 tablet, Rfl: 0   metoprolol tartrate (LOPRESSOR) 25 MG tablet, Take 25 mg by mouth daily., Disp: , Rfl:    naproxen (NAPROSYN) 500 MG tablet, Take 1 tablet (500 mg total) by mouth 2 (two) times daily with a meal., Disp: 30 tablet, Rfl: 0   ondansetron (ZOFRAN) 4 MG tablet, Take 1 tablet (4 mg total) by mouth every 8 (eight) hours as needed for nausea or vomiting., Disp: 20 tablet, Rfl: 0   oxyCODONE (OXY IR/ROXICODONE) 5 MG immediate release tablet, Take 1 tablet (5 mg total) by mouth every 6 (six) hours as needed for up to 5 days for severe pain., Disp: 20 tablet, Rfl: 0   triamterene-hydrochlorothiazide (MAXZIDE-25) 37.5-25 MG per tablet, Take 1 tablet by mouth daily., Disp: , Rfl:   Past Medical Problems: Past Medical History:  Diagnosis Date   Asthma    COPD (chronic obstructive pulmonary disease) (HCC)    Depression    Hyperlipidemia    Hypertension     Past Surgical History: Past Surgical History:  Procedure Laterality Date   ABDOMINAL HYSTERECTOMY     partial, has ovaries   BLADDER SURGERY     "bladder tack"    Social History: Social History   Socioeconomic History   Marital status: Divorced    Spouse name: Not on file  Number of children: Not on file   Years of education: Not on file   Highest education level: Not on file  Occupational History   Not on file  Tobacco Use   Smoking status: Never   Smokeless tobacco: Never  Substance and Sexual Activity   Alcohol use: No   Drug use: No   Sexual activity: Not Currently  Other Topics Concern   Not on file  Social History Narrative   ** Merged History Encounter **       Social Determinants of Health   Financial Resource Strain: Not on file  Food Insecurity: Not on file  Transportation Needs: Not on file  Physical Activity: Not on file  Stress: Not on file  Social Connections: Not on file  Intimate Partner  Violence: Not on file    Family History: Family History  Problem Relation Age of Onset   Hypertension Maternal Grandmother    Hypertension Father    Hypertension Mother    Colon cancer Mother     Review of Systems: Review of Systems  Constitutional: Negative.   Respiratory: Negative.    Cardiovascular: Negative.   Gastrointestinal: Negative.   Musculoskeletal:  Positive for back pain.  Neurological: Negative.     Physical Exam: Vital Signs BP (!) 151/70 (BP Location: Left Arm, Patient Position: Sitting, Cuff Size: Large)   Pulse 68   Wt 230 lb 6.4 oz (104.5 kg)   SpO2 99%   BMI 40.81 kg/m   Physical Exam  Constitutional:      General: Not in acute distress.    Appearance: Normal appearance. Not ill-appearing.  HENT:     Head: Normocephalic and atraumatic.  Eyes:     Pupils: Pupils are equal, round Neck:     Musculoskeletal: Normal range of motion.  Cardiovascular:     Rate and Rhythm: Normal rate    Pulses: Normal pulses.  Pulmonary:     Effort: Pulmonary effort is normal. No respiratory distress.  Abdominal:     General: Abdomen is flat. There is no distension.  Musculoskeletal: Normal range of motion.  Skin:    General: Skin is warm and dry.     Findings: No erythema or rash.  Neurological:     General: No focal deficit present.     Mental Status: Alert and oriented to person, place, and time. Mental status is at baseline.     Motor: No weakness.  Psychiatric:        Mood and Affect: Mood normal.        Behavior: Behavior normal.    Assessment/Plan: The patient is scheduled for panniculectomy with Dr. Luppens.  Risks, benefits, and alternatives of procedure discussed, questions answered and consent obtained.    Smoking Status: Non-smoker; Counseling Given?  N/A  Caprini Score: 6, high; Risk Factors include: Age, BMI greater than 40, and length of planned surgery. Recommendation for mechanical  prophylaxis. Encourage early ambulation.   Pictures  obtained: @consult  Post-op Rx sent to pharmacy: Oxycodone, Zofran  Patient was provided with the  General Surgical Risk consent document and Pain Medication Agreement prior to their appointment.  They had adequate time to read through the risk consent documents and Pain Medication Agreement. We also discussed them in person together during this preop appointment. All of their questions were answered to their satisfaction.  Recommended calling if they have any further questions.  Risk consent form and Pain Medication Agreement to be scanned into patient's chart.  The risk that can   be encountered for this procedure were discussed and include the following but not limited to these: asymmetry, fluid accumulation, firmness of the tissue, skin loss, decrease or no sensation, fat necrosis, bleeding, infection, healing delay.  Deep vein thrombosis, cardiac and pulmonary complications are risks to any procedure.  There are risks of anesthesia, changes to skin sensation and injury to nerves or blood vessels.  The muscle can be temporarily or permanently injured.  You may have an allergic reaction to tape, suture, glue, blood products which can result in skin discoloration, swelling, pain, skin lesions, poor healing.  Any of these can lead to the need for revisonal surgery or stage procedures.  Weight gain and weigh loss can also effect the long term appearance. The results are not guaranteed to last a lifetime.  Future surgery may be required.      Electronically signed by: Kermit Balo Raksha Wolfgang, PA-C 12/16/2021 2:46 PM

## 2021-12-16 NOTE — H&P (View-Only) (Signed)
Patient ID: Nicole Heath, female    DOB: Aug 18, 1960, 61 y.o.   MRN: [id number]  Chief Complaint  Patient presents with   Pre-op Exam      ICD-10-CM   1. Panniculitis  M79.3       History of Present Illness: Nicole Heath is a 61 y.o.  female  with a history of panniculitis.  She presents for preoperative evaluation for upcoming procedure, panniculectomy, scheduled for 12/21/2021 with Dr. Domenica Reamer.  The patient has not had problems with anesthesia. No history of DVT/PE.  No family history of DVT/PE.  No family or personal history of bleeding or clotting disorders.  Patient is not currently taking any blood thinners.  No history of CVA/MI.   Summary of Previous Visit: Non-smoker, does not use tobacco products.  She is not on blood thinners.  She has a history of hysterectomy in 2004 with bladder tacking.  Prior history of hepatitis C, but reports this was treated.  PMH Significant for: Hypertension, hyperlipidemia, asthma/copd  Patient reports her asthma has been well controlled, reports she only uses her albuterol as needed.  She reports that she is no longer taking her Abilify for bipolar disorder, reports she stopped this because she noted that it caused weight gain.  She denies any changes in her health recently, denies any cardiac or pulmonary disease   Past Medical History: Allergies: No Known Allergies  Current Medications:  Current Outpatient Medications:    albuterol (ACCUNEB) 1.25 MG/3ML nebulizer solution, Inhale 1 ampule into the lungs daily as needed., Disp: , Rfl:    amLODipine (NORVASC) 5 MG tablet, Take 1 tablet by mouth daily., Disp: , Rfl:    ARIPiprazole (ABILIFY) 10 MG tablet, Take 1 tablet by mouth daily., Disp: , Rfl:    atorvastatin (LIPITOR) 20 MG tablet, Take 1 tablet by mouth daily., Disp: , Rfl:    diazepam (VALIUM) 5 MG tablet, Take 1 tablet (5 mg total) by mouth every 8 (eight) hours as needed for muscle spasms., Disp: 10 tablet, Rfl: 0    fluconazole (DIFLUCAN) 150 MG tablet, Take one tablet now. Repeat in 3 days if needed., Disp: 2 tablet, Rfl: 0   metoprolol tartrate (LOPRESSOR) 25 MG tablet, Take 25 mg by mouth daily., Disp: , Rfl:    naproxen (NAPROSYN) 500 MG tablet, Take 1 tablet (500 mg total) by mouth 2 (two) times daily with a meal., Disp: 30 tablet, Rfl: 0   ondansetron (ZOFRAN) 4 MG tablet, Take 1 tablet (4 mg total) by mouth every 8 (eight) hours as needed for nausea or vomiting., Disp: 20 tablet, Rfl: 0   oxyCODONE (OXY IR/ROXICODONE) 5 MG immediate release tablet, Take 1 tablet (5 mg total) by mouth every 6 (six) hours as needed for up to 5 days for severe pain., Disp: 20 tablet, Rfl: 0   triamterene-hydrochlorothiazide (MAXZIDE-25) 37.5-25 MG per tablet, Take 1 tablet by mouth daily., Disp: , Rfl:   Past Medical Problems: Past Medical History:  Diagnosis Date   Asthma    COPD (chronic obstructive pulmonary disease) (HCC)    Depression    Hyperlipidemia    Hypertension     Past Surgical History: Past Surgical History:  Procedure Laterality Date   ABDOMINAL HYSTERECTOMY     partial, has ovaries   BLADDER SURGERY     "bladder tack"    Social History: Social History   Socioeconomic History   Marital status: Divorced    Spouse name: Not on file  Number of children: Not on file   Years of education: Not on file   Highest education level: Not on file  Occupational History   Not on file  Tobacco Use   Smoking status: Never   Smokeless tobacco: Never  Substance and Sexual Activity   Alcohol use: No   Drug use: No   Sexual activity: Not Currently  Other Topics Concern   Not on file  Social History Narrative   ** Merged History Encounter **       Social Determinants of Health   Financial Resource Strain: Not on file  Food Insecurity: Not on file  Transportation Needs: Not on file  Physical Activity: Not on file  Stress: Not on file  Social Connections: Not on file  Intimate Partner  Violence: Not on file    Family History: Family History  Problem Relation Age of Onset   Hypertension Maternal Grandmother    Hypertension Father    Hypertension Mother    Colon cancer Mother     Review of Systems: Review of Systems  Constitutional: Negative.   Respiratory: Negative.    Cardiovascular: Negative.   Gastrointestinal: Negative.   Musculoskeletal:  Positive for back pain.  Neurological: Negative.     Physical Exam: Vital Signs BP (!) 151/70 (BP Location: Left Arm, Patient Position: Sitting, Cuff Size: Large)   Pulse 68   Wt 230 lb 6.4 oz (104.5 kg)   SpO2 99%   BMI 40.81 kg/m   Physical Exam  Constitutional:      General: Not in acute distress.    Appearance: Normal appearance. Not ill-appearing.  HENT:     Head: Normocephalic and atraumatic.  Eyes:     Pupils: Pupils are equal, round Neck:     Musculoskeletal: Normal range of motion.  Cardiovascular:     Rate and Rhythm: Normal rate    Pulses: Normal pulses.  Pulmonary:     Effort: Pulmonary effort is normal. No respiratory distress.  Abdominal:     General: Abdomen is flat. There is no distension.  Musculoskeletal: Normal range of motion.  Skin:    General: Skin is warm and dry.     Findings: No erythema or rash.  Neurological:     General: No focal deficit present.     Mental Status: Alert and oriented to person, place, and time. Mental status is at baseline.     Motor: No weakness.  Psychiatric:        Mood and Affect: Mood normal.        Behavior: Behavior normal.    Assessment/Plan: The patient is scheduled for panniculectomy with Dr. Domenica Reamer.  Risks, benefits, and alternatives of procedure discussed, questions answered and consent obtained.    Smoking Status: Non-smoker; Counseling Given?  N/A  Caprini Score: 6, high; Risk Factors include: Age, BMI greater than 40, and length of planned surgery. Recommendation for mechanical  prophylaxis. Encourage early ambulation.   Pictures  obtained: @consult   Post-op Rx sent to pharmacy: Oxycodone, Zofran  Patient was provided with the  General Surgical Risk consent document and Pain Medication Agreement prior to their appointment.  They had adequate time to read through the risk consent documents and Pain Medication Agreement. We also discussed them in person together during this preop appointment. All of their questions were answered to their satisfaction.  Recommended calling if they have any further questions.  Risk consent form and Pain Medication Agreement to be scanned into patient's chart.  The risk that can  be encountered for this procedure were discussed and include the following but not limited to these: asymmetry, fluid accumulation, firmness of the tissue, skin loss, decrease or no sensation, fat necrosis, bleeding, infection, healing delay.  Deep vein thrombosis, cardiac and pulmonary complications are risks to any procedure.  There are risks of anesthesia, changes to skin sensation and injury to nerves or blood vessels.  The muscle can be temporarily or permanently injured.  You may have an allergic reaction to tape, suture, glue, blood products which can result in skin discoloration, swelling, pain, skin lesions, poor healing.  Any of these can lead to the need for revisonal surgery or stage procedures.  Weight gain and weigh loss can also effect the long term appearance. The results are not guaranteed to last a lifetime.  Future surgery may be required.      Electronically signed by: Kermit Balo Agustin Swatek, PA-C 12/16/2021 2:46 PM

## 2021-12-20 NOTE — Progress Notes (Signed)
Unable to reach pt by phone or leave a vm, to let her know that her surgery time has been moved forward.. Text sent asking pt to now arrive at [facility] for surgery on 12/21/21

## 2021-12-21 ENCOUNTER — Encounter (HOSPITAL_BASED_OUTPATIENT_CLINIC_OR_DEPARTMENT_OTHER)
Admission: RE | Disposition: A | Discharge status: Home / Self Care | Payer: Self-pay | Source: Ambulatory Visit | Attending: Plastic Surgery

## 2021-12-21 ENCOUNTER — Ambulatory Visit (HOSPITAL_BASED_OUTPATIENT_CLINIC_OR_DEPARTMENT_OTHER): Payer: Medicare Other | Admitting: Anesthesiology

## 2021-12-21 ENCOUNTER — Other Ambulatory Visit: Payer: Self-pay

## 2021-12-21 ENCOUNTER — Encounter (HOSPITAL_BASED_OUTPATIENT_CLINIC_OR_DEPARTMENT_OTHER): Payer: Self-pay | Admitting: Plastic Surgery

## 2021-12-21 ENCOUNTER — Observation Stay (HOSPITAL_BASED_OUTPATIENT_CLINIC_OR_DEPARTMENT_OTHER)
Admission: RE | Admit: 2021-12-21 | Discharge: 2021-12-22 | Disposition: A | Discharge status: Home / Self Care | Payer: Medicare Other | Source: Ambulatory Visit | Attending: Plastic Surgery | Admitting: Plastic Surgery

## 2021-12-21 DIAGNOSIS — J45909 Unspecified asthma, uncomplicated: Secondary | ICD-10-CM | POA: Insufficient documentation

## 2021-12-21 DIAGNOSIS — M793 Panniculitis, unspecified: Secondary | ICD-10-CM | POA: Diagnosis present

## 2021-12-21 DIAGNOSIS — J449 Chronic obstructive pulmonary disease, unspecified: Secondary | ICD-10-CM

## 2021-12-21 DIAGNOSIS — E65 Localized adiposity: Secondary | ICD-10-CM | POA: Diagnosis present

## 2021-12-21 DIAGNOSIS — I10 Essential (primary) hypertension: Secondary | ICD-10-CM | POA: Insufficient documentation

## 2021-12-21 DIAGNOSIS — Z79899 Other long term (current) drug therapy: Secondary | ICD-10-CM | POA: Diagnosis not present

## 2021-12-21 DIAGNOSIS — Z6841 Body Mass Index (BMI) 40.0 and over, adult: Secondary | ICD-10-CM

## 2021-12-21 HISTORY — DX: Chronic obstructive pulmonary disease, unspecified: J44.9

## 2021-12-21 HISTORY — PX: PANNICULECTOMY: SHX5360

## 2021-12-21 SURGERY — PANNICULECTOMY
Anesthesia: General | Site: Abdomen

## 2021-12-21 MED ORDER — ONDANSETRON 4 MG PO TBDP: 4.0000 mg | ORAL_TABLET | Freq: Four times a day (QID) | ORAL | Status: DC | PRN

## 2021-12-21 MED ORDER — HYDROMORPHONE HCL 1 MG/ML IJ SOLN: 1.0000 mg | INTRAMUSCULAR | Status: DC | PRN

## 2021-12-21 MED ORDER — MIDAZOLAM HCL 5 MG/5ML IJ SOLN
INTRAMUSCULAR | Status: DC | PRN
  Administered 2021-12-21: 2 mg via INTRAVENOUS

## 2021-12-21 MED ORDER — BUPIVACAINE-EPINEPHRINE (PF) 0.25% -1:200000 IJ SOLN
INTRAMUSCULAR | Status: DC | PRN
  Administered 2021-12-21: 4 mL via PERINEURAL

## 2021-12-21 MED ORDER — EPHEDRINE 5 MG/ML INJ
INTRAVENOUS | Status: AC
  Filled 2021-12-21: qty 5

## 2021-12-21 MED ORDER — ACETAMINOPHEN 10 MG/ML IV SOLN
INTRAVENOUS | Status: DC | PRN
  Administered 2021-12-21: 1000 mg via INTRAVENOUS

## 2021-12-21 MED ORDER — SUGAMMADEX SODIUM 500 MG/5ML IV SOLN
INTRAVENOUS | Status: DC | PRN
  Administered 2021-12-21: 300 mg via INTRAVENOUS

## 2021-12-21 MED ORDER — OXYCODONE HCL 5 MG/5ML PO SOLN: 5.0000 mg | Freq: Once | ORAL | Status: AC | PRN

## 2021-12-21 MED ORDER — LACTATED RINGERS IV SOLN: INTRAVENOUS | Status: DC

## 2021-12-21 MED ORDER — FENTANYL CITRATE (PF) 100 MCG/2ML IJ SOLN
INTRAMUSCULAR | Status: AC
  Filled 2021-12-21: qty 2

## 2021-12-21 MED ORDER — EPINEPHRINE PF 1 MG/ML IJ SOLN
INTRAMUSCULAR | Status: AC
  Filled 2021-12-21: qty 2

## 2021-12-21 MED ORDER — BUPIVACAINE-EPINEPHRINE (PF) 0.25% -1:200000 IJ SOLN
INTRAMUSCULAR | Status: AC
  Filled 2021-12-21: qty 30

## 2021-12-21 MED ORDER — ONDANSETRON HCL 4 MG/2ML IJ SOLN: 4.0000 mg | Freq: Four times a day (QID) | INTRAMUSCULAR | Status: DC | PRN

## 2021-12-21 MED ORDER — HYDROMORPHONE HCL 1 MG/ML IJ SOLN
INTRAMUSCULAR | Status: AC
  Filled 2021-12-21: qty 0.5

## 2021-12-21 MED ORDER — ACETAMINOPHEN 10 MG/ML IV SOLN
INTRAVENOUS | Status: AC
  Filled 2021-12-21: qty 100

## 2021-12-21 MED ORDER — PROPOFOL 10 MG/ML IV BOLUS
INTRAVENOUS | Status: DC | PRN
  Administered 2021-12-21: 200 mg via INTRAVENOUS

## 2021-12-21 MED ORDER — ONDANSETRON HCL 4 MG/2ML IJ SOLN
INTRAMUSCULAR | Status: DC | PRN
  Administered 2021-12-21: 4 mg via INTRAVENOUS

## 2021-12-21 MED ORDER — FENTANYL CITRATE (PF) 100 MCG/2ML IJ SOLN
INTRAMUSCULAR | Status: DC | PRN
  Administered 2021-12-21: 50 ug via INTRAVENOUS
  Administered 2021-12-21: 100 ug via INTRAVENOUS
  Administered 2021-12-21: 50 ug via INTRAVENOUS

## 2021-12-21 MED ORDER — OXYCODONE HCL 5 MG PO TABS
5.0000 mg | ORAL_TABLET | ORAL | Status: DC | PRN
  Administered 2021-12-21 – 2021-12-22 (×2): 5 mg via ORAL
  Filled 2021-12-21 (×2): qty 1

## 2021-12-21 MED ORDER — CEFAZOLIN SODIUM-DEXTROSE 2-4 GM/100ML-% IV SOLN
INTRAVENOUS | Status: AC
  Filled 2021-12-21: qty 100

## 2021-12-21 MED ORDER — OXYCODONE HCL 5 MG PO TABS
5.0000 mg | ORAL_TABLET | Freq: Once | ORAL | Status: AC | PRN
  Administered 2021-12-21: 5 mg via ORAL

## 2021-12-21 MED ORDER — CEFAZOLIN SODIUM-DEXTROSE 2-4 GM/100ML-% IV SOLN
2.0000 g | INTRAVENOUS | Status: AC
  Administered 2021-12-21: 2 g via INTRAVENOUS

## 2021-12-21 MED ORDER — KETOROLAC TROMETHAMINE 30 MG/ML IJ SOLN
INTRAMUSCULAR | Status: AC
  Filled 2021-12-21: qty 1

## 2021-12-21 MED ORDER — HYDROCODONE-ACETAMINOPHEN 5-325 MG PO TABS: 1.0000 | ORAL_TABLET | Freq: Four times a day (QID) | ORAL | Status: DC | PRN

## 2021-12-21 MED ORDER — LIDOCAINE HCL (CARDIAC) PF 100 MG/5ML IV SOSY
PREFILLED_SYRINGE | INTRAVENOUS | Status: DC | PRN
  Administered 2021-12-21: 100 mg via INTRAVENOUS

## 2021-12-21 MED ORDER — LACTATED RINGERS IV SOLN
INTRAVENOUS | Status: DC | PRN
  Administered 2021-12-21: 1000 mL

## 2021-12-21 MED ORDER — ONDANSETRON HCL 4 MG/2ML IJ SOLN: 4.0000 mg | Freq: Once | INTRAMUSCULAR | Status: DC | PRN

## 2021-12-21 MED ORDER — DEXAMETHASONE SODIUM PHOSPHATE 4 MG/ML IJ SOLN
INTRAMUSCULAR | Status: DC | PRN
  Administered 2021-12-21: 10 mg via INTRAVENOUS

## 2021-12-21 MED ORDER — DOCUSATE SODIUM 100 MG PO CAPS
100.0000 mg | ORAL_CAPSULE | Freq: Two times a day (BID) | ORAL | Status: DC
  Administered 2021-12-21 (×2): 100 mg via ORAL
  Filled 2021-12-21 (×2): qty 1

## 2021-12-21 MED ORDER — ACETAMINOPHEN 500 MG PO TABS
1000.0000 mg | ORAL_TABLET | Freq: Four times a day (QID) | ORAL | Status: DC
  Administered 2021-12-21 – 2021-12-22 (×3): 1000 mg via ORAL
  Filled 2021-12-21 (×3): qty 2

## 2021-12-21 MED ORDER — SODIUM CHLORIDE 0.9 % IV SOLN: INTRAVENOUS | Status: DC | PRN

## 2021-12-21 MED ORDER — DIPHENHYDRAMINE HCL 25 MG PO CAPS
25.0000 mg | ORAL_CAPSULE | Freq: Two times a day (BID) | ORAL | Status: DC | PRN
  Administered 2021-12-21: 25 mg via ORAL
  Filled 2021-12-21 (×2): qty 1

## 2021-12-21 MED ORDER — 0.9 % SODIUM CHLORIDE (POUR BTL) OPTIME
TOPICAL | Status: DC | PRN
  Administered 2021-12-21: 1000 mL

## 2021-12-21 MED ORDER — MIDAZOLAM HCL 2 MG/2ML IJ SOLN
INTRAMUSCULAR | Status: AC
  Filled 2021-12-21: qty 2

## 2021-12-21 MED ORDER — HYDROMORPHONE HCL 1 MG/ML IJ SOLN
0.2500 mg | INTRAMUSCULAR | Status: DC | PRN
  Administered 2021-12-21 (×4): 0.5 mg via INTRAVENOUS

## 2021-12-21 MED ORDER — EPHEDRINE SULFATE (PRESSORS) 50 MG/ML IJ SOLN
INTRAMUSCULAR | Status: DC | PRN
  Administered 2021-12-21: 15 mg via INTRAVENOUS

## 2021-12-21 MED ORDER — OXYCODONE HCL 5 MG PO TABS
ORAL_TABLET | ORAL | Status: AC
  Filled 2021-12-21: qty 1

## 2021-12-21 MED ORDER — KETOROLAC TROMETHAMINE 30 MG/ML IJ SOLN
30.0000 mg | Freq: Once | INTRAMUSCULAR | Status: AC | PRN
  Administered 2021-12-21: 30 mg via INTRAVENOUS

## 2021-12-21 MED ORDER — CHLORHEXIDINE GLUCONATE CLOTH 2 % EX PADS: 6.0000 | MEDICATED_PAD | Freq: Once | CUTANEOUS | Status: DC

## 2021-12-21 MED ORDER — BUPIVACAINE HCL (PF) 0.25 % IJ SOLN
INTRAMUSCULAR | Status: AC
  Filled 2021-12-21: qty 60

## 2021-12-21 MED ORDER — ROCURONIUM BROMIDE 100 MG/10ML IV SOLN
INTRAVENOUS | Status: DC | PRN
  Administered 2021-12-21: 100 mg via INTRAVENOUS

## 2021-12-21 SURGICAL SUPPLY — 67 items
ADH SKN CLS APL DERMABOND .7 (GAUZE/BANDAGES/DRESSINGS)
APL PRP STRL LF DISP 70% ISPRP (MISCELLANEOUS) ×1
APL SKNCLS STERI-STRIP NONHPOA (GAUZE/BANDAGES/DRESSINGS) ×2
APPLIER CLIP 9.375 MED OPEN (MISCELLANEOUS) ×1
APR CLP MED 9.3 20 MLT OPN (MISCELLANEOUS) ×1
BENZOIN TINCTURE PRP APPL 2/3 (GAUZE/BANDAGES/DRESSINGS) ×2 IMPLANT
BINDER ABDOMINAL  9 SM 30-45 (SOFTGOODS) ×1
BINDER ABDOMINAL 12 SM 30-45 (SOFTGOODS) ×1 IMPLANT
BINDER ABDOMINAL 9 SM 30-45 (SOFTGOODS) IMPLANT
BLADE CLIPPER SURG (BLADE) IMPLANT
BLADE SURG 10 STRL SS (BLADE) ×1 IMPLANT
BLADE SURG 11 STRL SS (BLADE) ×1 IMPLANT
BLADE SURG 15 STRL LF DISP TIS (BLADE) IMPLANT
BLADE SURG 15 STRL SS (BLADE)
CANISTER SUCT 1200ML W/VALVE (MISCELLANEOUS) ×1 IMPLANT
CHLORAPREP W/TINT 26 (MISCELLANEOUS) ×1 IMPLANT
CLIP APPLIE 9.375 MED OPEN (MISCELLANEOUS) IMPLANT
COVER BACK TABLE 60X90IN (DRAPES) ×1 IMPLANT
COVER MAYO STAND STRL (DRAPES) ×1 IMPLANT
DERMABOND ADVANCED .7 DNX12 (GAUZE/BANDAGES/DRESSINGS) IMPLANT
DRAIN CHANNEL 15F RND FF W/TCR (WOUND CARE) ×2 IMPLANT
DRAPE LAPAROSCOPIC ABDOMINAL (DRAPES) ×1 IMPLANT
DRAPE UTILITY XL STRL (DRAPES) ×1 IMPLANT
ELECT BLADE 4.0 EZ CLEAN MEGAD (MISCELLANEOUS)
ELECT COATED BLADE 2.86 ST (ELECTRODE) IMPLANT
ELECT REM PT RETURN 9FT ADLT (ELECTROSURGICAL) ×1
ELECTRODE BLDE 4.0 EZ CLN MEGD (MISCELLANEOUS) IMPLANT
ELECTRODE REM PT RTRN 9FT ADLT (ELECTROSURGICAL) ×1 IMPLANT
EVACUATOR SILICONE 100CC (DRAIN) ×2 IMPLANT
GAUZE PAD ABD 8X10 STRL (GAUZE/BANDAGES/DRESSINGS) ×3 IMPLANT
GAUZE SPONGE 4X4 12PLY STRL (GAUZE/BANDAGES/DRESSINGS) IMPLANT
GLOVE BIOGEL M STRL SZ7.5 (GLOVE) ×1 IMPLANT
GLOVE BIOGEL PI IND STRL 7.5 (GLOVE) ×1 IMPLANT
GOWN STRL REUS W/ TWL LRG LVL3 (GOWN DISPOSABLE) ×3 IMPLANT
GOWN STRL REUS W/TWL LRG LVL3 (GOWN DISPOSABLE) ×3
NDL HYPO 25X1 1.5 SAFETY (NEEDLE) IMPLANT
NDL SPNL 18GX3.5 QUINCKE PK (NEEDLE) IMPLANT
NEEDLE HYPO 25X1 1.5 SAFETY (NEEDLE) IMPLANT
NEEDLE SPNL 18GX3.5 QUINCKE PK (NEEDLE) IMPLANT
NS IRRIG 1000ML POUR BTL (IV SOLUTION) ×1 IMPLANT
PACK BASIN DAY SURGERY FS (CUSTOM PROCEDURE TRAY) ×1 IMPLANT
PENCIL SMOKE EVACUATOR (MISCELLANEOUS) ×1 IMPLANT
PIN SAFETY STERILE (MISCELLANEOUS) ×1 IMPLANT
SLEEVE SCD COMPRESS KNEE MED (STOCKING) ×1 IMPLANT
SPONGE T-LAP 18X18 ~~LOC~~+RFID (SPONGE) ×2 IMPLANT
STAPLER INSORB 30 2030 C-SECTI (MISCELLANEOUS) ×1 IMPLANT
STAPLER VISISTAT 35W (STAPLE) ×1 IMPLANT
STRIP SUTURE WOUND CLOSURE 1/2 (MISCELLANEOUS) ×3 IMPLANT
SUT ETHILON 2 0 FS 18 (SUTURE) ×2 IMPLANT
SUT MNCRL AB 4-0 PS2 18 (SUTURE) ×2 IMPLANT
SUT PDS 3-0 CT2 (SUTURE) ×2
SUT PDS AB 0 CT 36 (SUTURE) ×2 IMPLANT
SUT PDS AB 2-0 CT2 27 (SUTURE) ×3 IMPLANT
SUT PDS II 3-0 CT2 27 ABS (SUTURE) IMPLANT
SUT SILK 2 0 SH (SUTURE) IMPLANT
SUT VIC AB 2-0 CT1 27 (SUTURE)
SUT VIC AB 2-0 CT1 TAPERPNT 27 (SUTURE) ×4 IMPLANT
SUT VLOC 180 0 24IN GS25 (SUTURE) IMPLANT
SUT VLOC 90 P-14 23 (SUTURE) ×2 IMPLANT
SYR 50ML LL SCALE MARK (SYRINGE) ×1 IMPLANT
SYR BULB IRRIG 60ML STRL (SYRINGE) ×1 IMPLANT
SYR CONTROL 10ML LL (SYRINGE) IMPLANT
TOWEL GREEN STERILE FF (TOWEL DISPOSABLE) ×2 IMPLANT
TUBE CONNECTING 20X1/4 (TUBING) ×1 IMPLANT
TUBING INFILTRATION IT-10001 (TUBING) ×1 IMPLANT
UNDERPAD 30X36 HEAVY ABSORB (UNDERPADS AND DIAPERS) ×2 IMPLANT
YANKAUER SUCT BULB TIP NO VENT (SUCTIONS) ×1 IMPLANT

## 2021-12-21 NOTE — Op Note (Signed)
Operative Report  Date of operation: 12/21/2021  Patient: Nicole Heath, MRN: [id number], 61 y.o. female.   Date of birth: 01/16/1961  Location: [place]  Preoperative Diagnosis: Panniculitis  Postoperative Diagnosis:  Same  Procedure: Panniculectomy  Surgeon:  Heywood Footman, MD  Assistant:  Verdie Shire, PAC, Krista Blue, [facility]  Anesthesia:  General  EBL:  250cc  Drains:  2 number 11 blake round drains  Condition:  Stable  Complications: None  Disposition: Recovery Room  Procedure in Detail: Patient was seen the morning of her surgery and marked out for the procedure. She was then given an IV and IV antibiotics. The patient was taken to the operating room and underwent general anesthesia.  A time out was called and all information was confirmed to be correct. SCD's and a pillow under the knees was in place. The patient was then prepped and draped in the standard sterile fashion. Local was injected into the skin.  One liter of tumescent was injected into the skin incision containing 30 ml of 0.25% marcaine and 1 amp of epinephrine.  The planned lower incision was then incised and the incision taken down through the Scarpa's fascia to the rectus abdominus fascia. The skin and subcutaneous tissue was then lifted off the fascia below the umbilicus.  The upper incision was made and dissection was carried down to the abdominal fascia.  The patient was then flexed on the table and the amount that could be excised was confirmed. The pannus was excised and it weighed 4255 grams.  The wound was irrigated with normal saline solution. Two #15 blake round drains were placed and secured with 2-0 Silk. The patient was put back in the flexed position and the closure begun.  The abdominal wall was closed with buried 3-0 PDS and subcuticular 3-0 V-Lock 90.    The wound was then steri-strips. ABD's and an abdominal binder were placed. Patient was allowed to wake up, extubated and  taken on a stretcher in the flexed position to the recovery room. Family was notified at the end of the case.  The advanced practice practitioner (APP) assisted throughout the case.  The primary assistant Matt Scheeler was essential in retraction and counter traction when needed to make the case progress smoothly.  This retraction and assistance made it possible to see the tissue planes for the procedure.  The assistance was needed for hemostasis, tissue re-approximation and closure of the incision site.

## 2021-12-21 NOTE — Anesthesia Preprocedure Evaluation (Signed)
Anesthesia Evaluation  Patient identified by MRN, date of birth, ID band Patient awake    Reviewed: Allergy & Precautions, NPO status , Patient's Chart, lab work & pertinent test results  Airway Mallampati: III  TM Distance: <3 FB Neck ROM: Full    Dental no notable dental hx.    Pulmonary asthma , COPD,  COPD inhaler,    Pulmonary exam normal breath sounds clear to auscultation       Cardiovascular hypertension, Normal cardiovascular exam Rhythm:Regular Rate:Normal     Neuro/Psych Depression negative neurological ROS     GI/Hepatic negative GI ROS, Neg liver ROS,   Endo/Other  Morbid obesity  Renal/GU negative Renal ROS  negative genitourinary   Musculoskeletal negative musculoskeletal ROS (+)   Abdominal   Peds negative pediatric ROS (+)  Hematology negative hematology ROS (+)   Anesthesia Other Findings   Reproductive/Obstetrics negative OB ROS                             Anesthesia Physical Anesthesia Plan  ASA: 3  Anesthesia Plan: General   Post-op Pain Management: Ofirmev IV (intra-op)*   Induction: Intravenous  PONV Risk Score and Plan: 3 and Ondansetron, Dexamethasone and Treatment may vary due to age or medical condition  Airway Management Planned: Oral ETT  Additional Equipment:   Intra-op Plan:   Post-operative Plan: Extubation in OR  Informed Consent: I have reviewed the patients History and Physical, chart, labs and discussed the procedure including the risks, benefits and alternatives for the proposed anesthesia with the patient or authorized representative who has indicated his/her understanding and acceptance.     Dental advisory given  Plan Discussed with: CRNA and Surgeon  Anesthesia Plan Comments:         Anesthesia Quick Evaluation

## 2021-12-21 NOTE — Anesthesia Procedure Notes (Signed)
Procedure Name: Intubation Date/Time: 12/21/2021 9:34 AM  Performed by: Tawni Millers, CRNAPre-anesthesia Checklist: Patient identified, Emergency Drugs available, Suction available and Patient being monitored Patient Re-evaluated:Patient Re-evaluated prior to induction Oxygen Delivery Method: Circle system utilized Preoxygenation: Pre-oxygenation with 100% oxygen Induction Type: IV induction Ventilation: Mask ventilation without difficulty Laryngoscope Size: Glidescope and 3 Grade View: Grade III Tube type: Oral Tube size: 7.0 mm Number of attempts: 1 Airway Equipment and Method: Stylet and Oral airway Placement Confirmation: ETT inserted through vocal cords under direct vision, positive ETCO2 and breath sounds checked- equal and bilateral Tube secured with: Tape Dental Injury: Teeth and Oropharynx as per pre-operative assessment  Difficulty Due To: Difficult Airway- due to large tongue

## 2021-12-21 NOTE — Anesthesia Postprocedure Evaluation (Signed)
Anesthesia Post Note  Patient: Nicole Heath  Procedure(s) Performed: PANNICULECTOMY (Abdomen)     Patient location during evaluation: PACU Anesthesia Type: General Level of consciousness: awake and alert Pain management: pain level controlled Vital Signs Assessment: post-procedure vital signs reviewed and stable Respiratory status: spontaneous breathing, nonlabored ventilation, respiratory function stable and patient connected to nasal cannula oxygen Cardiovascular status: blood pressure returned to baseline and stable Postop Assessment: no apparent nausea or vomiting Anesthetic complications: no   No notable events documented.  Last Vitals:  Vitals:   12/21/21 1215 12/21/21 1230  BP: (!) 144/89 (!) 156/94  Pulse: 82 82  Resp: 16 14  Temp:    SpO2: 100% 100%    Last Pain:  Vitals:   12/21/21 1230  TempSrc:   PainSc: 10-Worst pain ever                 Ladaija Dimino S

## 2021-12-21 NOTE — Transfer of Care (Signed)
Immediate Anesthesia Transfer of Care Note  Patient: Nicole Heath  Procedure(s) Performed: PANNICULECTOMY (Abdomen)  Patient Location: PACU  Anesthesia Type:General  Level of Consciousness: awake  Airway & Oxygen Therapy: Patient Spontanous Breathing and Patient connected to face mask oxygen  Post-op Assessment: Report given to RN and Post -op Vital signs reviewed and stable  Post vital signs: Reviewed and stable  Last Vitals:  Vitals Value Taken Time  BP 157/79 12/21/21 1150  Temp 36.4 C 12/21/21 1150  Pulse 87 12/21/21 1153  Resp 18 12/21/21 1153  SpO2 100 % 12/21/21 1153  Vitals shown include unvalidated device data.  Last Pain:  Vitals:   12/21/21 1150  TempSrc:   PainSc: 9          Complications: No notable events documented.

## 2021-12-21 NOTE — Interval H&P Note (Signed)
History and Physical Interval Note:  12/21/2021 8:35 AM  Nicole Heath  has presented today for surgery, with the diagnosis of Panniculitis.  The various methods of treatment have been discussed with the patient and family. After consideration of risks, benefits and other options for treatment, the patient has consented to  Procedure(s): PANNICULECTOMY (N/A) as a surgical intervention.  The patient's history has been reviewed, patient examined, no change in status, stable for surgery.  I have reviewed the patient's chart and labs.  Questions were answered to the patient's satisfaction.     [name]

## 2021-12-21 NOTE — Discharge Instructions (Addendum)
Activity As tolerated: NO showers until 3 days after surgery. Keep binder on 24/7 unless showering or changing dressings. This is important to prevent additional swelling.  NO driving while in pain, taking pain medication or if you are unable to safely react to traffic. No heavy activities. No lifting > 15 pounds.  Take oxycodone as needed for severe pain. Otherwise, you can use ibuprofen or tylenol PRN as well as indicated on your pre-op breast reduction instruction sheet provided at your pre-op appointment.  Diet: Regular. Drink plenty of fluids (preferably water, avoid juice/soda) and eat healthy, high protein, low carbs.  Wound Care: Keep dressing clean & dry. You may change bandages after showering if you continue to notice some drainage.  Special Instructions: Call Doctor if any unusual problems occur such as pain, excessive Bleeding, unrelieved Nausea/vomiting, Fever &/or chills  Drains: Measure drain output from drains every 24 hours. Record this on a log for [place] to view during post-op appointments. Drainage will change colors over the next week to two weeks. This is normal. Color of drainage can vary from red-pink-orange-yellow.  Follow-up appointment: Scheduled for next week.   Post Anesthesia Home Care Instructions  Activity: Get plenty of rest for the remainder of the day. A responsible individual must stay with you for 24 hours following the procedure.  For the next 24 hours, DO NOT: -Drive a car -Advertising copywriter -Drink alcoholic beverages -Take any medication unless instructed by your physician -Make any legal decisions or sign important papers.  Meals: Start with liquid foods such as gelatin or soup. Progress to regular foods as tolerated. Avoid greasy, spicy, heavy foods. If nausea and/or vomiting occur, drink only clear liquids until the nausea and/or vomiting subsides. Call your physician if vomiting continues.  Special Instructions/Symptoms: Your throat may  feel dry or sore from the anesthesia or the breathing tube placed in your throat during surgery. If this causes discomfort, gargle with warm salt water. The discomfort should disappear within 24 hours.  If you had a scopolamine patch placed behind your ear for the management of post- operative nausea and/or vomiting:  1. The medication in the patch is effective for 72 hours, after which it should be removed.  Wrap patch in a tissue and discard in the trash. Wash hands thoroughly with soap and water. 2. You may remove the patch earlier than 72 hours if you experience unpleasant side effects which may include dry mouth, dizziness or visual disturbances. 3. Avoid touching the patch. Wash your hands with soap and water after contact with the patch.      About my Jackson-Pratt Bulb Drain  What is a Jackson-Pratt bulb? A Jackson-Pratt is a soft, round device used to collect drainage. It is connected to a long, thin drainage catheter, which is held in place by one or two small stiches near your surgical incision site. When the bulb is squeezed, it forms a vacuum, forcing the drainage to empty into the bulb.  Emptying the Jackson-Pratt bulb- To empty the bulb: 1. Release the plug on the top of the bulb. 2. Pour the bulb's contents into a measuring container which your nurse will provide. 3. Record the time emptied and amount of drainage. Empty the drain(s) as often as your     doctor or nurse recommends.  Date                  Time  Amount (Drain 1)                 Amount (Drain  2)  _____________________________________________________________________  _____________________________________________________________________  _____________________________________________________________________  _____________________________________________________________________  _____________________________________________________________________  _____________________________________________________________________  _____________________________________________________________________  _____________________________________________________________________  Squeezing the Jackson-Pratt Bulb- To squeeze the bulb: 1. Make sure the plug at the top of the bulb is open. 2. Squeeze the bulb tightly in your fist. You will hear air squeezing from the bulb. 3. Replace the plug while the bulb is squeezed. 4. Use a safety pin to attach the bulb to your clothing. This will keep the catheter from     pulling at the bulb insertion site.  When to call your doctor- Call your doctor if: Drain site becomes red, swollen or hot. You have a fever greater than 101 degrees F. There is oozing at the drain site. Drain falls out (apply a guaze bandage over the drain hole and secure it with tape). Drainage increases daily not related to activity patterns. (You will usually have more drainage when you are active than when you are resting.) Drainage has a bad odor.

## 2021-12-22 ENCOUNTER — Encounter (HOSPITAL_BASED_OUTPATIENT_CLINIC_OR_DEPARTMENT_OTHER): Payer: Self-pay | Admitting: Plastic Surgery

## 2021-12-22 DIAGNOSIS — M793 Panniculitis, unspecified: Secondary | ICD-10-CM | POA: Diagnosis not present

## 2021-12-22 NOTE — Discharge Summary (Signed)
Physician Discharge Summary  Patient ID: Nicole Heath MRN: [id number] DOB/AGE: [birth year] 61 y.o.  Admit date: 12/21/2021 Discharge date: 12/22/2021  Admission Diagnoses:  Discharge Diagnoses:  Principal Problem:   Abdominal pannus   Discharged Condition: Patient is doing well postoperatively.  She is able to tolerate p.o. intake without difficulty.  She experienced itching overnight associated with her opiate analgesics, controlled with antihistamines.  Ambulatory.  Denies any leg swelling or discomfort, severe abdominal pain or swelling, bright red blood in drains.  She feels comfortable with managing her drains.  Her husband is at bedside and is also aware of instructions and precautions.  Hospital Course: Admitted for observation following panniculectomy.  Consults: None  Significant Diagnostic Studies: None  Treatments: surgery: Panniculectomy  Discharge Exam: Blood pressure 128/67, pulse 70, temperature 98.1 F (36.7 C), resp. rate 16, height 5\' 3"  (1.6 m), weight 104.6 kg, SpO2 97 %. General appearance: alert, cooperative, no distress, and comfortable GI: Soft, nondistended.  Steri-Strips in place.  No significant swelling or ecchymosis noted.  Minimal tenderness.  JP drains intact and functional, normal-appearing drainage in bulbs. Extremities: No lower extremity swelling or edema  Disposition: Discharge disposition: 01-Home or Self Care       Discharge Instructions     Call MD for:  difficulty breathing, headache or visual disturbances   Complete by: As directed    Call MD for:  extreme fatigue   Complete by: As directed    Call MD for:  hives   Complete by: As directed    Call MD for:  persistant dizziness or light-headedness   Complete by: As directed    Call MD for:  persistant nausea and vomiting   Complete by: As directed    Call MD for:  redness, tenderness, or signs of infection (pain, swelling, redness, odor or Nareg Breighner/yellow discharge around  incision site)   Complete by: As directed    Call MD for:  severe uncontrolled pain   Complete by: As directed    Call MD for:  temperature >100.4   Complete by: As directed    Diet - low sodium heart healthy   Complete by: As directed    Diet - low sodium heart healthy   Complete by: As directed    Increase activity slowly   Complete by: As directed    Increase activity slowly   Complete by: As directed       Allergies as of 12/22/2021   No Known Allergies      Medication List     TAKE these medications    albuterol 1.25 MG/3ML nebulizer solution Commonly known as: ACCUNEB Inhale 1 ampule into the lungs daily as needed.   amLODipine 5 MG tablet Commonly known as: NORVASC Take 1 tablet by mouth daily.   ARIPiprazole 10 MG tablet Commonly known as: ABILIFY Take 1 tablet by mouth daily.   atorvastatin 20 MG tablet Commonly known as: LIPITOR Take 1 tablet by mouth daily.   diazepam 5 MG tablet Commonly known as: VALIUM Take 1 tablet (5 mg total) by mouth every 8 (eight) hours as needed for muscle spasms.   fluconazole 150 MG tablet Commonly known as: Diflucan Take one tablet now. Repeat in 3 days if needed.   metoprolol tartrate 25 MG tablet Commonly known as: LOPRESSOR Take 25 mg by mouth daily.   ondansetron 4 MG tablet Commonly known as: Zofran Take 1 tablet (4 mg total) by mouth every 8 (eight) hours as needed for nausea or  vomiting.   triamterene-hydrochlorothiazide 37.5-25 MG tablet Commonly known as: MAXZIDE-25 Take 1 tablet by mouth daily.       ASK your doctor about these medications    oxyCODONE 5 MG immediate release tablet Commonly known as: Oxy IR/ROXICODONE Take 1 tablet (5 mg total) by mouth every 6 (six) hours as needed for up to 5 days for severe pain. Ask about: Should I take this medication?         [facility] [address] [place], [place] [address] [phone]  Signed: Evelena Leyden 12/22/2021,  8:14 AM

## 2021-12-29 ENCOUNTER — Ambulatory Visit (INDEPENDENT_AMBULATORY_CARE_PROVIDER_SITE_OTHER): Payer: Medicare Other | Admitting: Physician Assistant

## 2021-12-29 ENCOUNTER — Encounter: Payer: Self-pay | Admitting: Surgical

## 2021-12-29 DIAGNOSIS — Z9889 Other specified postprocedural states: Secondary | ICD-10-CM

## 2021-12-29 NOTE — Progress Notes (Signed)
Patient is a very pleasant 61 year old female with PMH of recurrent panniculitis s/p panniculectomy performed 12/21/2021 by Dr. Erin Hearing who presents to clinic for postoperative follow-up.  Reviewed operative report and pannus weighing 4255 g was removed at time of surgery.  Blake drains were utilized, secured with 2-0 silk.  Today, patient is doing well.  She is accompanied by her daughter at bedside.  She reports that she has not had to take her narcotic pain medication and has been managing her discomfort on Tylenol alone.  She is eating and drinking well, normal bowel movements.  Ambulatory.  They report volume output of 30 cc/day from each drain, down from 60 cc/day.  Denies any leg swelling, shortness of breath, fevers, or severe pain.  She is quite pleased with the surgical outcome thus far.  Patient is regularly utilizing a compressive binder.  Physical exam is entirely reassuring.  Steri-Strips removed without complication or difficulty.  Excision repair site appears to be healing well, two small less than 0.5 cm wounds on left lateral aspect of incision.  Nondraining.  No other wounds.  Abdomen is soft, nondistended.  No erythema or other overlying skin changes.  No ecchymoses.  Nontender.  Drains are intact and functional.  Normal-appearing drainage in bulbs bilaterally.  Continue with compressive garments and activity modifications.  Return to clinic early next week for postoperative photos as well as likely removal of drains.  ABD pads provided to help with collection of any incisional drainage or mild bleeding.  She will call the clinic should she have any questions or concerns in interim.

## 2021-12-31 NOTE — Progress Notes (Signed)
Patient is a very pleasant 60 year old female with PMH of recurrent panniculitis s/p panniculectomy performed 12/21/2021 by Dr. Erin Hearing who presents to clinic for postoperative follow-up.  She was last seen here in clinic on 12/29/2021.  At that time, she still had fluctuating volume output greater than 30 cc/day.  Exam was entirely reassuring, but plan was to abstain from drain removal until subsequent visit.  Today, patient is doing well.  No complaints.  Volume output from drains has been less than 10 cc/day x 3 days.  Reports that pain is well controlled.  She states that she is exceedingly pleased with her outcome.  She has been ambulatory, eating and drinking well.  She had been staying with her daughter, but plans to return home after drains are removed.  They report that she has not had any bleeding or drainage from the incision.  They have been applying a thin film of Aquaphor daily to the incision as well as drain tube insertion sites.  Physical exam is entirely reassuring.  Abdomen is soft, nontender.  No incisional wounds or areas of drainage.  JP drains intact and functional, normal-appearing drainage in bulbs.  JP drains removed without complication or difficulty.  Exam is excellent.  Return in 2 weeks for postoperative photos and to discuss scar management.

## 2022-01-03 ENCOUNTER — Ambulatory Visit (INDEPENDENT_AMBULATORY_CARE_PROVIDER_SITE_OTHER): Payer: Medicare Other | Admitting: Physician Assistant

## 2022-01-03 ENCOUNTER — Encounter: Payer: Self-pay | Admitting: Physician Assistant

## 2022-01-03 DIAGNOSIS — Z9889 Other specified postprocedural states: Secondary | ICD-10-CM

## 2022-01-05 ENCOUNTER — Telehealth: Payer: Self-pay

## 2022-01-05 NOTE — Telephone Encounter (Signed)
Faxed written documentation asking to excuse pt from jury duty due to recent surgical procedure.  Faxed to [facility] [phone]) with confirmed receipt. I will mail a copy of fax with confirmation to pt for her records.

## 2022-01-24 ENCOUNTER — Encounter: Payer: Medicare Other | Admitting: Physician Assistant

## 2022-02-02 ENCOUNTER — Encounter: Payer: Medicare Other | Admitting: Physician Assistant

## 2022-02-02 NOTE — Progress Notes (Deleted)
Patient is a very pleasant 61 year old female with PMH of recurrent panniculitis s/p panniculectomy performed 12/21/2021 by Dr. Erin Hearing who presents to clinic for postoperative follow-up.  She was last seen here in clinic on 01/03/2022.  At that time, exam was entirely reassuring and there were no incisional wounds or areas of drainage.  JP drains were removed and plan was for her to return in 2 weeks for postoperative photos and to discuss scar management.  Today,

## 2022-05-16 ENCOUNTER — Ambulatory Visit: Payer: Medicare Other | Admitting: Physician Assistant

## 2022-05-18 ENCOUNTER — Encounter: Payer: Self-pay | Admitting: Physician Assistant

## 2022-05-18 ENCOUNTER — Ambulatory Visit (INDEPENDENT_AMBULATORY_CARE_PROVIDER_SITE_OTHER): Payer: 59 | Admitting: Physician Assistant

## 2022-05-18 VITALS — BP 150/90 | HR 84

## 2022-05-18 DIAGNOSIS — Z08 Encounter for follow-up examination after completed treatment for malignant neoplasm: Secondary | ICD-10-CM | POA: Diagnosis not present

## 2022-05-18 DIAGNOSIS — M793 Panniculitis, unspecified: Secondary | ICD-10-CM | POA: Diagnosis not present

## 2022-05-18 DIAGNOSIS — Z9889 Other specified postprocedural states: Secondary | ICD-10-CM

## 2022-05-18 NOTE — Progress Notes (Signed)
Referring Provider No referring provider defined for this encounter.   CC:  Chief Complaint  Patient presents with   Follow-up      Nicole Heath is an 62 y.o. female.  HPI: Patient is a very pleasant 62 year old female with PMH of recurrent panniculitis s/p panniculectomy performed 12/21/2021 by Dr. Erin Hearing who presents to clinic for follow-up.   She was last seen here in the office on 01/03/2022.  At that time, exam was reassuring and there were no obvious wounds or complications.  JP drains were removed and plan was for follow-up in 2 weeks for postoperative photos and to discuss scar management.  Unfortunately, she had to miss the subsequent appointment(s), but presents today for 68-monthfollow-up.  Today, she is doing exquisitely well.  She states that she has healed entirely well and does not have any scarring or cosmetic concerns.  She states that she attempted to use a silicone sheet, but started to have irritation from the tape.  Since then, she has remained using Aquaphor as needed with good effect.  She is now simply trying to lose weight to address her upper abdomen, but is pleased with the outcome of her surgery.   No Known Allergies  Outpatient Encounter Medications as of 05/18/2022  Medication Sig   albuterol (ACCUNEB) 1.25 MG/3ML nebulizer solution Inhale 1 ampule into the lungs daily as needed.   amLODipine (NORVASC) 5 MG tablet Take 1 tablet by mouth daily.   ARIPiprazole (ABILIFY) 10 MG tablet Take 1 tablet by mouth daily.   atorvastatin (LIPITOR) 20 MG tablet Take 1 tablet by mouth daily.   diazepam (VALIUM) 5 MG tablet Take 1 tablet (5 mg total) by mouth every 8 (eight) hours as needed for muscle spasms.   fluconazole (DIFLUCAN) 150 MG tablet Take one tablet now. Repeat in 3 days if needed.   metoprolol tartrate (LOPRESSOR) 25 MG tablet Take 25 mg by mouth daily.   ondansetron (ZOFRAN) 4 MG tablet Take 1 tablet (4 mg total) by mouth every 8 (eight) hours as  needed for nausea or vomiting.   triamterene-hydrochlorothiazide (MAXZIDE-25) 37.5-25 MG per tablet Take 1 tablet by mouth daily.   No facility-administered encounter medications on file as of 05/18/2022.     Past Medical History:  Diagnosis Date   Asthma    [place]    Depression    Hyperlipidemia    Hypertension     Past Surgical History:  Procedure Laterality Date   ABDOMINAL HYSTERECTOMY     partial, has ovaries   BLADDER SURGERY     "bladder tack"   PANNICULECTOMY N/A 12/21/2021   Procedure: PANNICULECTOMY;  Surgeon: LLennice Sites MD;  [place] [facility]  Service: Plastics;  Laterality: N/A;    Family History  Problem Relation Age of Onset   Hypertension Maternal Grandmother    Hypertension Father    Hypertension Mother    Colon cancer Mother     Social History   Social History Narrative   ** Merged History Encounter **         Review of Systems General: Denies fevers or chills Abdomen: Denies any wounds or issues with scarring  Physical Exam    05/18/2022    9:38 AM 12/22/2021    7:00 AM 12/22/2021    6:00 AM  Vitals with BMI  Systolic [id number]  Diastolic 90 67   Pulse 84 70 84    General:  No acute distress,  nontoxic appearing  Respiratory: No increased work of breathing Neuro: Alert and oriented Psychiatric: Normal mood and affect  Abdomen: Panniculectomy incision well-healed.  No hypertrophy or keloiding noted.  Incisions are soft, no appreciable underlying scarring.  Assessment/Plan  Panniculectomy follow-up: She looks excellent today and has not had any issues with hypertrophic or hyperpigmented scarring.  Continue with Aquaphor as needed.  As for the upper abdomen, discussed ongoing lifestyle modifications and she will discuss further weight management strategies with her primary care provider.  She is quite pleased with the outcome of her panniculectomy and no revision surgery  indicated or requested.  She can certainly follow-up with the clinic should she have any future questions or concerns.  Picture(s) obtained of the patient and placed in the chart were with the patient's or guardian's permission.   Krista Blue 05/18/2022, 11:02 AM

## 2022-05-19 ENCOUNTER — Ambulatory Visit
Admission: EM | Admit: 2022-05-19 | Discharge: 2022-05-19 | Disposition: A | Discharge status: Home / Self Care | Payer: 59

## 2022-05-19 DIAGNOSIS — R09A2 Foreign body sensation, throat: Secondary | ICD-10-CM | POA: Diagnosis not present

## 2022-05-19 NOTE — ED Provider Notes (Signed)
[facility]    CSN: [id number] Arrival date & time: 05/19/22  1521      History   Chief Complaint Chief Complaint  Patient presents with   trouble swallowing    Trouble swallowing x2 days    HPI Nicole Heath is a 62 y.o. female presents for evaluation of globus sensation.  Patient reports a couple days ago she noticed a sensation of something being stuck in her throat.  She feels like it goes up and down in her throat.  Denies sore throat, throat/tongue/lip swelling, difficulty breathing or swallowing.  No fevers or chills.  No URI symptoms.  Denies history of enlarged thyroid.  Does have history of GERD and states she is been taking her medication regularly.  No vomiting or neck pain.  No other concerns at this time.  HPI  Past Medical History:  Diagnosis Date   Asthma    COPD (chronic obstructive pulmonary disease) ([place])    Depression    Hyperlipidemia    Hypertension     Patient Active Problem List   Diagnosis Date Noted   Abdominal pannus 12/21/2021    Past Surgical History:  Procedure Laterality Date   ABDOMINAL HYSTERECTOMY     partial, has ovaries   BLADDER SURGERY     "bladder tack"   PANNICULECTOMY N/A 12/21/2021   Procedure: PANNICULECTOMY;  Surgeon: [name], MD;  Location: [facility];  Service: Plastics;  Laterality: N/A;    OB History     Gravida  4   Para  3   Term  3   Preterm  0   AB  1   Living  3      SAB  0   IAB  1   Ectopic  0   Multiple  0   Live Births  3            Home Medications    Prior to Admission medications   Medication Sig Start Date End Date Taking? Authorizing Provider  albuterol (ACCUNEB) 1.25 MG/3ML nebulizer solution Inhale 1 ampule into the lungs daily as needed. 09/21/18  Yes [provider]  amLODipine (NORVASC) 5 MG tablet Take 1 tablet by mouth daily. 09/04/20  Yes [provider]  ARIPiprazole (ABILIFY) 10 MG tablet Take 1 tablet by mouth  daily. 09/04/20  Yes [provider]  atorvastatin (LIPITOR) 20 MG tablet Take 1 tablet by mouth daily. 03/19/18  Yes [provider]  diazepam (VALIUM) 5 MG tablet Take 1 tablet (5 mg total) by mouth every 8 (eight) hours as needed for muscle spasms. 12/31/13  Yes Piepenbrink, Jennifer, PA-C  fluconazole (DIFLUCAN) 150 MG tablet Take one tablet now. Repeat in 3 days if needed. 02/10/18  Yes Caryl Ada K, PA-C  metoprolol tartrate (LOPRESSOR) 25 MG tablet Take 25 mg by mouth daily.   Yes [provider]  ondansetron (ZOFRAN) 4 MG tablet Take 1 tablet (4 mg total) by mouth every 8 (eight) hours as needed for nausea or vomiting. 12/16/21  Yes Scheeler, Carola Rhine, PA-C  triamterene-hydrochlorothiazide (MAXZIDE-25) 37.5-25 MG per tablet Take 1 tablet by mouth daily.   Yes [provider]    Family History Family History  Problem Relation Age of Onset   Hypertension Maternal Grandmother    Hypertension Father    Hypertension Mother    Colon cancer Mother     Social History Social History   Tobacco Use   Smoking status: Never   Smokeless  tobacco: Never  Substance Use Topics   Alcohol use: No   Drug use: No     Allergies   Patient has no known allergies.   Review of Systems Review of Systems  HENT:         Globus sensation      Physical Exam Triage Vital Signs ED Triage Vitals  Enc Vitals Group     BP 05/19/22 1546 135/77     Pulse Rate 05/19/22 1546 74     Resp 05/19/22 1546 18     Temp 05/19/22 1546 97.7 F (36.5 C)     Temp Source 05/19/22 1546 Oral     SpO2 05/19/22 1546 96 %     Weight 05/19/22 1545 220 lb (99.8 kg)     Height 05/19/22 1545 5' 2"$  (1.575 m)     Head Circumference --      Peak Flow --      Pain Score 05/19/22 1545 0     Pain Loc --      Pain Edu? --      Excl. in [place]? --    No data found.  Updated Vital Signs BP 135/77 (BP Location: Right Arm)   Pulse 74   Temp 97.7 F (36.5 C) (Oral)   Resp 18   Ht 5'  2" (1.575 m)   Wt 220 lb (99.8 kg)   SpO2 96%   BMI 40.24 kg/m   Visual Acuity Right Eye Distance:   Left Eye Distance:   Bilateral Distance:    Right Eye Near:   Left Eye Near:    Bilateral Near:     Physical Exam Vitals and nursing note reviewed.  Constitutional:      Appearance: Normal appearance.  HENT:     Head: Normocephalic and atraumatic.     Mouth/Throat:     Lips: Pink.     Pharynx: Oropharynx is clear. Uvula midline. No pharyngeal swelling, oropharyngeal exudate, posterior oropharyngeal erythema or uvula swelling.  Eyes:     Pupils: Pupils are equal, round, and reactive to light.  Neck:     Thyroid: No thyroid mass, thyromegaly or thyroid tenderness.  Cardiovascular:     Rate and Rhythm: Normal rate and regular rhythm.     Heart sounds: Normal heart sounds.  Pulmonary:     Effort: Pulmonary effort is normal.     Breath sounds: Normal breath sounds.  Musculoskeletal:     Cervical back: Normal range of motion and neck supple. No tenderness.  Lymphadenopathy:     Cervical: No cervical adenopathy.  Skin:    General: Skin is warm and dry.  Neurological:     General: No focal deficit present.     Mental Status: She is alert and oriented to person, place, and time.  Psychiatric:        Mood and Affect: Mood normal.        Behavior: Behavior normal.      UC Treatments / Results  Labs (all labs ordered are listed, but only abnormal results are displayed) Labs Reviewed - No data to display  EKG   Radiology No results found.  Procedures Procedures (including critical care time)  Medications Ordered in UC Medications - No data to display  Initial Impression / Assessment and Plan / UC Course  I have reviewed the triage vital signs and the nursing notes.  Pertinent labs & imaging results that were available during my care of the patient were reviewed by me and considered  in my medical decision making (see chart for details).     Reviewed with  patient globus sensation.  No red flags on exam Advised her to follow-up with her PCP for further evaluation Continue acid reflux medications ER precautions reviewed and patient verbalized understanding Final Clinical Impressions(s) / UC Diagnoses   Final diagnoses:  Globus sensation     Discharge Instructions      Please follow-up with your PCP for further evaluation of your symptoms Please go to the ER if you develop any worsening symptoms    ED Prescriptions   None    PDMP not reviewed this encounter.   Melynda Ripple, NP 05/19/22 1626

## 2022-05-19 NOTE — Discharge Instructions (Addendum)
Please follow-up with your PCP for further evaluation of your symptoms Please go to the ER if you develop any worsening symptoms

## 2022-05-19 NOTE — ED Triage Notes (Signed)
Pt states that she has some trouble swallowing. X2 days  Pt states that she feels as if her food wants to get stuck in her throat.

## 2022-05-20 ENCOUNTER — Ambulatory Visit (HOSPITAL_COMMUNITY): Payer: 59

## 2022-06-03 ENCOUNTER — Ambulatory Visit (HOSPITAL_COMMUNITY)
Admission: EM | Admit: 2022-06-03 | Discharge: 2022-06-03 | Disposition: A | Discharge status: Home / Self Care | Payer: 59 | Attending: Family Medicine | Admitting: Family Medicine

## 2022-06-03 ENCOUNTER — Ambulatory Visit (INDEPENDENT_AMBULATORY_CARE_PROVIDER_SITE_OTHER): Payer: 59

## 2022-06-03 ENCOUNTER — Encounter (HOSPITAL_COMMUNITY): Payer: Self-pay

## 2022-06-03 DIAGNOSIS — R0602 Shortness of breath: Secondary | ICD-10-CM

## 2022-06-03 DIAGNOSIS — J441 Chronic obstructive pulmonary disease with (acute) exacerbation: Secondary | ICD-10-CM | POA: Diagnosis not present

## 2022-06-03 MED ORDER — PREDNISONE 20 MG PO TABS: 40.0000 mg | ORAL_TABLET | Freq: Every day | ORAL | 0 refills | Status: AC

## 2022-06-03 MED ORDER — IPRATROPIUM BROMIDE HFA 17 MCG/ACT IN AERS: 2.0000 | INHALATION_SPRAY | RESPIRATORY_TRACT | 12 refills | Status: AC | PRN

## 2022-06-03 MED ORDER — ALBUTEROL SULFATE HFA 108 (90 BASE) MCG/ACT IN AERS: 1.0000 | INHALATION_SPRAY | Freq: Four times a day (QID) | RESPIRATORY_TRACT | 0 refills | Status: AC | PRN

## 2022-06-03 NOTE — Discharge Instructions (Addendum)
Your symptoms are consistent with a COPD exacerbation.  Please use your albuterol inhaler every 6 hours as needed for wheezing, please also take this in combination with the ipratropium inhaler every 4 hours as needed for wheezing.  Please start on steroids, 40 mg once daily for the next 5 days.  This certainly could be a response to mold, as this is a respiratory irritant.  Please follow-up with your primary care, as there are certain blood test that can be used to determine your immune system's response to mold and your PCP may deem these appropriate.  Please seek immediate care if you develop chest pain, worsening of shortness of breath, or worsening of symptoms.

## 2022-06-03 NOTE — ED Provider Notes (Signed)
[name]    CSN: [id number] Arrival date & time: 06/03/22  1240      History   Chief Complaint Chief Complaint  Patient presents with   Shortness of Breath    HPI Nicole Heath is a 62 y.o. female.   Reports she has had cough and SOB ongoing, she has started to wheeze and had to get a refill of her albuterol. She was staying in an apartment that had mildew and mold. This has been ongoing since October, when she first moved into the apartment.   Denies fever, cough is non-productive. She does not smoke, has never smoked. Hx of COPD.   The history is provided by the patient.  Shortness of Breath Associated symptoms: wheezing   Associated symptoms: no abdominal pain, no chest pain, no cough, no ear pain, no fever, no rash, no sore throat and no vomiting     Past Medical History:  Diagnosis Date   Asthma    COPD (chronic obstructive pulmonary disease) ([place])    Depression    Hyperlipidemia    Hypertension     Patient Active Problem List   Diagnosis Date Noted   Abdominal pannus 12/21/2021    Past Surgical History:  Procedure Laterality Date   ABDOMINAL HYSTERECTOMY     partial, has ovaries   BLADDER SURGERY     "bladder tack"   PANNICULECTOMY N/A 12/21/2021   Procedure: PANNICULECTOMY;  Surgeon: [name], MD;  Location: [place];  Service: Plastics;  Laterality: N/A;    OB History     Gravida  4   Para  3   Term  3   Preterm  0   AB  1   Living  3      SAB  0   IAB  1   Ectopic  0   Multiple  0   Live Births  3            Home Medications    Prior to Admission medications   Medication Sig Start Date End Date Taking? Authorizing Provider  albuterol (VENTOLIN HFA) 108 (90 Base) MCG/ACT inhaler Inhale 1-2 puffs into the lungs every 6 (six) hours as needed for wheezing or shortness of breath. 06/03/22  Yes Louretta Shorten, [place] N, FNP  amLODipine (NORVASC) 5 MG tablet Take 1 tablet by mouth daily.  09/04/20  Yes [provider]  ARIPiprazole (ABILIFY) 10 MG tablet Take 1 tablet by mouth daily. 09/04/20  Yes [provider]  atorvastatin (LIPITOR) 20 MG tablet Take 1 tablet by mouth daily. 03/19/18  Yes [provider]  diazepam (VALIUM) 5 MG tablet Take 1 tablet (5 mg total) by mouth every 8 (eight) hours as needed for muscle spasms. 12/31/13  Yes Piepenbrink, Anderson [place], PA-C  ipratropium (ATROVENT HFA) 17 MCG/ACT inhaler Inhale 2 puffs into the lungs every 4 (four) hours as needed for wheezing. 06/03/22  Yes Louretta Shorten, [place], FNP  predniSONE (DELTASONE) 20 MG tablet Take 2 tablets (40 mg total) by mouth daily for 5 days. 06/03/22 06/08/22 Yes Louretta Shorten, [place] N, FNP  triamterene-hydrochlorothiazide (MAXZIDE-25) 37.5-25 MG per tablet Take 1 tablet by mouth daily.   Yes [provider]    Family History Family History  Problem Relation Age of Onset   Hypertension Maternal Grandmother    Hypertension Father    Hypertension Mother    Colon cancer Mother     Social History Social History   Tobacco Use   Smoking status:  Never   Smokeless tobacco: Never  Substance Use Topics   Alcohol use: No   Drug use: No     Allergies   Patient has no known allergies.   Review of Systems Review of Systems  Constitutional:  Negative for chills and fever.  HENT:  Negative for ear pain and sore throat.   Eyes:  Negative for pain and visual disturbance.  Respiratory:  Positive for shortness of breath and wheezing. Negative for cough.   Cardiovascular:  Negative for chest pain and palpitations.  Gastrointestinal:  Negative for abdominal pain and vomiting.  Genitourinary:  Negative for dysuria and hematuria.  Musculoskeletal:  Negative for arthralgias and back pain.  Skin:  Negative for color change and rash.  Neurological:  Negative for seizures and syncope.  All other systems reviewed and are negative.    Physical Exam Triage Vital Signs ED Triage Vitals   Enc Vitals Group     BP 06/03/22 1244 (!) 145/67     Pulse Rate 06/03/22 1244 79     Resp 06/03/22 1244 (!) 22     Temp --      Temp src --      SpO2 06/03/22 1244 98 %     Weight 06/03/22 1248 230 lb (104.3 kg)     Height 06/03/22 1248 '5\' 2"'$  (1.575 m)     Head Circumference --      Peak Flow --      Pain Score 06/03/22 1246 6     Pain Loc --      Pain Edu? --      Excl. in [place]? --    No data found.  Updated Vital Signs BP (!) 145/67 (BP Location: Left Arm)   Pulse 79   Resp (!) 22   Ht '5\' 2"'$  (1.575 m)   Wt 230 lb (104.3 kg)   SpO2 98%   BMI 42.07 kg/m   Visual Acuity Right Eye Distance:   Left Eye Distance:   Bilateral Distance:    Right Eye Near:   Left Eye Near:    Bilateral Near:     Physical Exam Vitals and nursing note reviewed.  Constitutional:      General: She is not in acute distress.    Appearance: She is well-developed.  HENT:     Head: Normocephalic and atraumatic.     Right Ear: External ear normal.     Left Ear: External ear normal.     Nose: Nose normal.     Mouth/Throat:     Mouth: Mucous membranes are moist.     Pharynx: Posterior oropharyngeal erythema present.  Eyes:     Conjunctiva/sclera: Conjunctivae normal.  Cardiovascular:     Rate and Rhythm: Normal rate and regular rhythm.     Heart sounds: S1 normal and S2 normal. No murmur heard. Pulmonary:     Effort: Pulmonary effort is normal. No tachypnea, accessory muscle usage or respiratory distress.     Breath sounds: Normal breath sounds. No stridor.     Comments: Lungs vesicular posteriorly. Abdominal:     Tenderness: There is no abdominal tenderness.  Musculoskeletal:        General: No swelling.     Cervical back: Neck supple.  Lymphadenopathy:     Cervical: Cervical adenopathy present.  Skin:    General: Skin is warm and dry.     Capillary Refill: Capillary refill takes less than 2 seconds.  Neurological:     Mental Status: She is  alert.  Psychiatric:        Mood and  Affect: Mood normal.        Behavior: Behavior is cooperative.      UC Treatments / Results  Labs (all labs ordered are listed, but only abnormal results are displayed) Labs Reviewed - No data to display  EKG   Radiology DG Chest 2 View  Result Date: 06/03/2022 CLINICAL DATA:  COPD.  Short of breath EXAM: CHEST - 2 VIEW COMPARISON:  None Available. FINDINGS: Normal mediastinum and cardiac silhouette. Chronic central bronchitic markings. Normal pulmonary vasculature. No effusion, infiltrate, or pneumothorax. IMPRESSION: Chronic bronchitic markings. No acute findings. Electronically Signed   By: Suzy Bouchard M.D.   On: 06/03/2022 14:08    Procedures Procedures (including critical care time)  Medications Ordered in UC Medications - No data to display  Initial Impression / Assessment and Plan / UC Course  I have reviewed the triage vital signs and the nursing notes.  Pertinent labs & imaging results that were available during my care of the patient were reviewed by me and considered in my medical decision making (see chart for details).  Vitals and triage note reviewed.  Patient is afebrile.  Respiratory rate 18 when in room. Oxygenation 98% RA, NAD. Hx of COPD controlled with inhaled albuterol, lungs vesicular on exam. CXR in clinic with chronic bronchitic markings, no acute findings. I agree with interpretation.  Suspect COPD exacerbation in response to potential mold exposure. Ipatropium bromide inhaler q 4-6 hours w/ albuterol inhaler, prednisone 40 mg once daily in AM for 5 days.  Follow-up with primary care, encourage patient to eradicate mold in the living space.  Patient verbalized understanding of POC and follow-up care, no questions at this time.     Final Clinical Impressions(s) / UC Diagnoses   Final diagnoses:  COPD exacerbation [facility])     Discharge Instructions      Your symptoms are consistent with a COPD exacerbation.  Please use your albuterol inhaler every  6 hours as needed for wheezing, please also take this in combination with the ipratropium inhaler every 4 hours as needed for wheezing.  Please start on steroids, 40 mg once daily for the next 5 days.  This certainly could be a response to mold, as this is a respiratory irritant.  Please follow-up with your primary care, as there are certain blood test that can be used to determine your immune system's response to mold and your PCP may deem these appropriate.  Please seek immediate care if you develop chest pain, worsening of shortness of breath, or worsening of symptoms.     ED Prescriptions     Medication Sig Dispense Auth. Provider   ipratropium (ATROVENT HFA) 17 MCG/ACT inhaler Inhale 2 puffs into the lungs every 4 (four) hours as needed for wheezing. 1 each Josiah Wojtaszek, [place], FNP   albuterol (VENTOLIN HFA) 108 (90 Base) MCG/ACT inhaler Inhale 1-2 puffs into the lungs every 6 (six) hours as needed for wheezing or shortness of breath. 18 g Louretta Shorten, [place] N, [place]   predniSONE (DELTASONE) 20 MG tablet Take 2 tablets (40 mg total) by mouth daily for 5 days. 10 tablet Allina Riches, [place] N,       I have reviewed the PDMP during this encounter.   Huntington Leverich, [place] N,  06/03/22 [phone]

## 2022-06-03 NOTE — ED Triage Notes (Signed)
SOB and wheezing for the last 2 months. States her apartment has a lot of mold and mildew. Patient also has COPD.   SOB and wheezing getting worse. Patient has an inhaler with little relief.

## 2023-02-23 IMAGING — MG MM DIGITAL SCREENING BILAT W/ TOMO AND CAD
8 series · 8 of 24 positions shown · non-contrast
Comparison: Previous exam(s).

CLINICAL DATA: Screening.

EXAM:
DIGITAL SCREENING BILATERAL MAMMOGRAM WITH TOMOSYNTHESIS AND CAD
TECHNIQUE: Bilateral screening digital craniocaudal and mediolateral oblique
mammograms were obtained. Bilateral screening digital breast
tomosynthesis was performed. The images were evaluated with
computer-aided detection.

[R CC synth-2D]
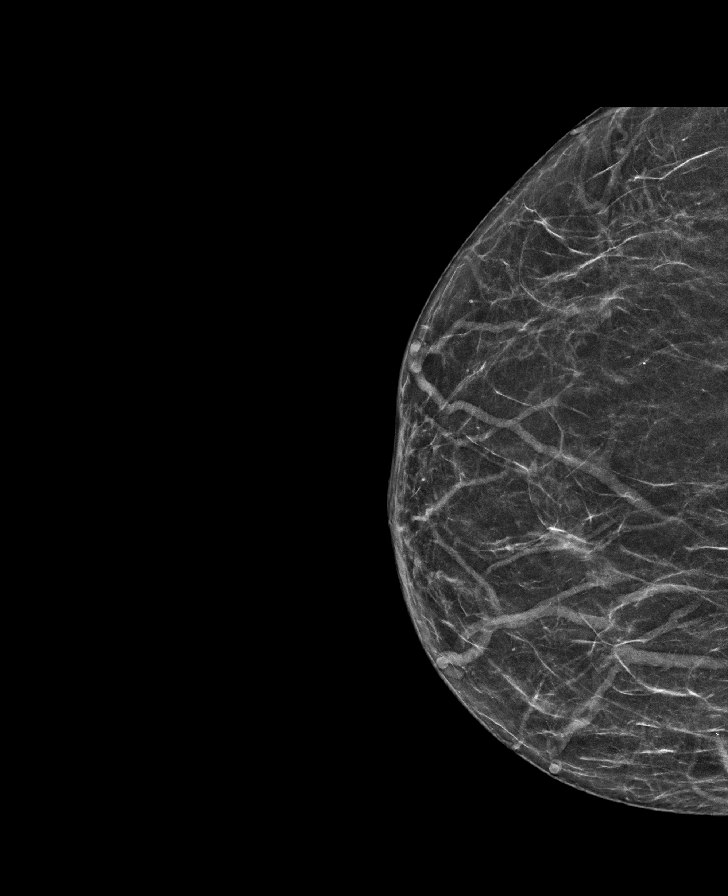

[L MLO synth-2D]
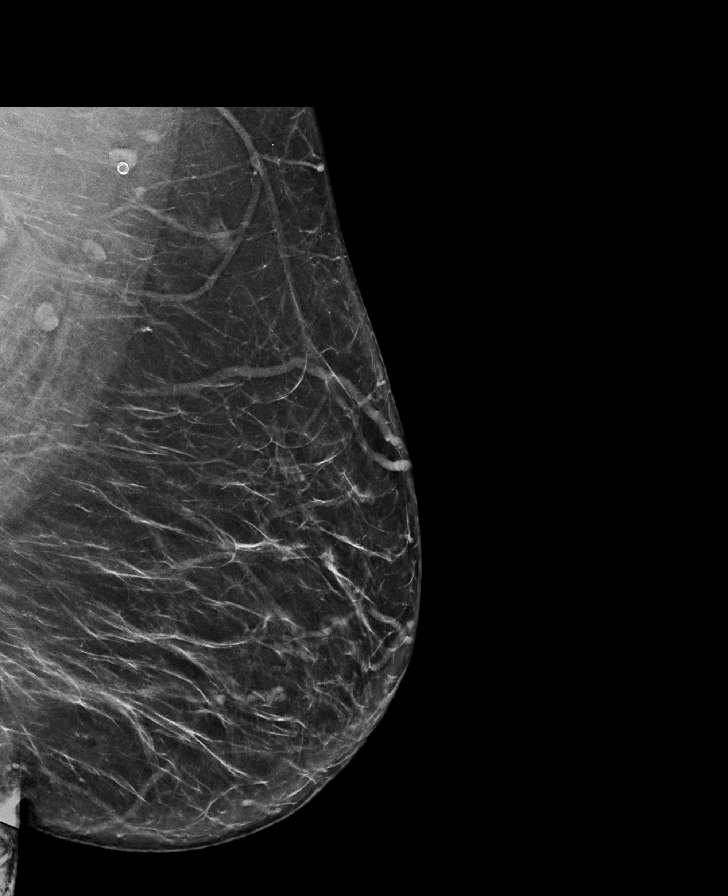

[L CC synth-2D]
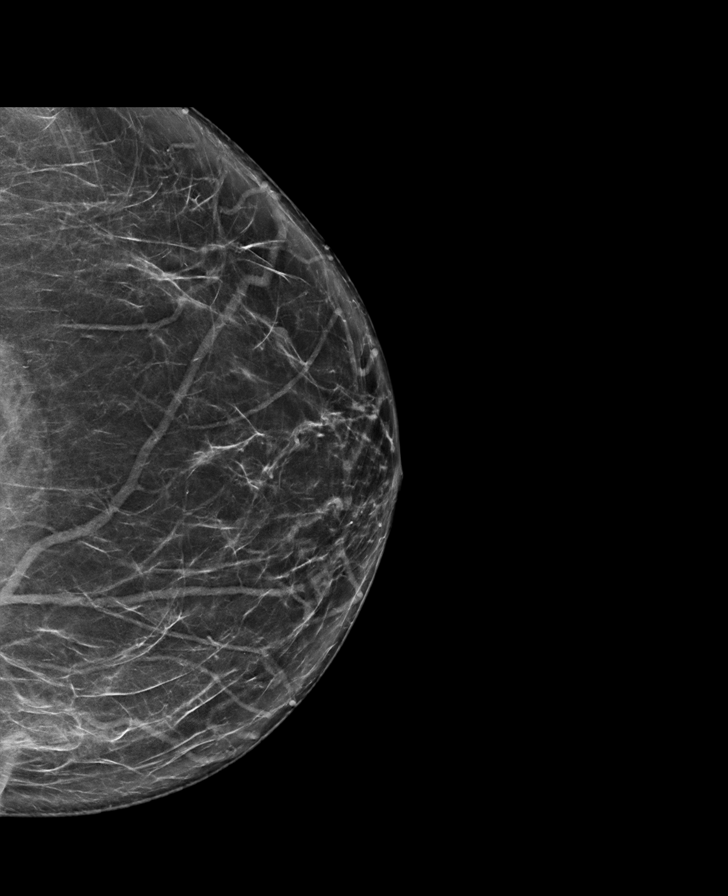

[R MLO synth-2D]
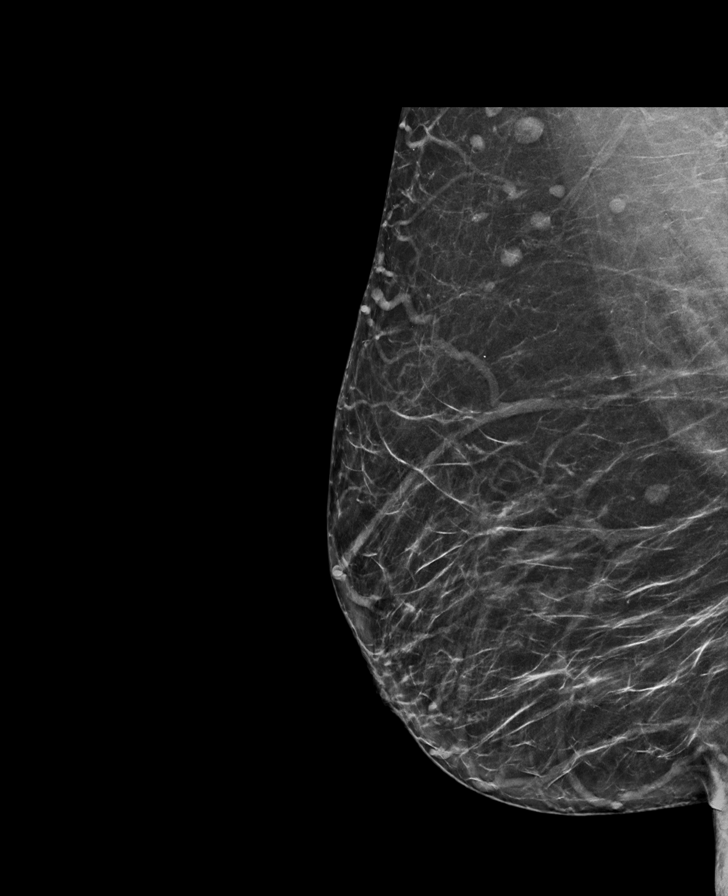

[L MLO tomo · tomo slice 43/86.0]
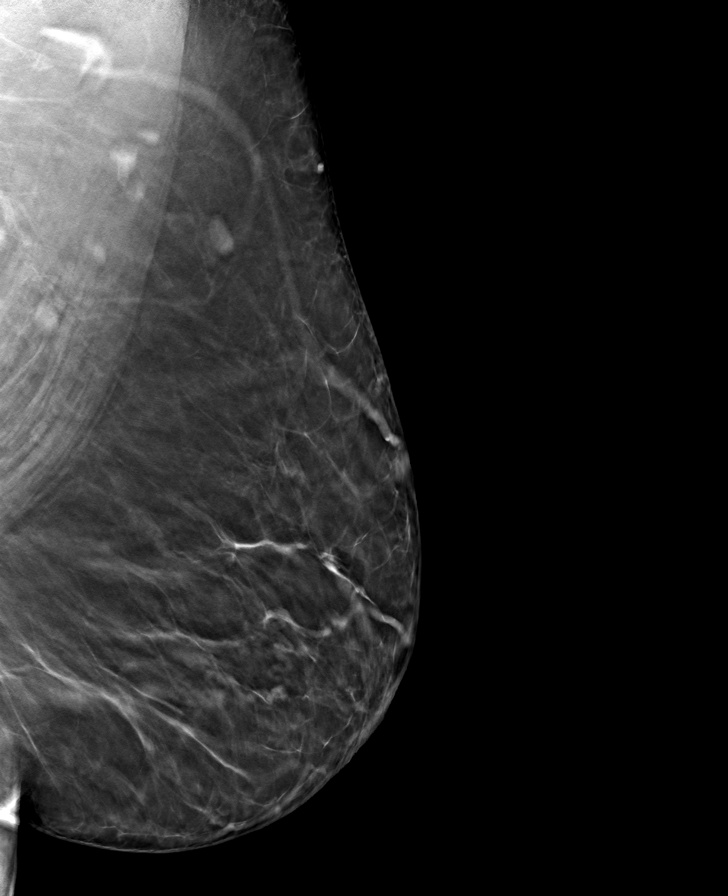

[R CC tomo · tomo slice 29/57.0]
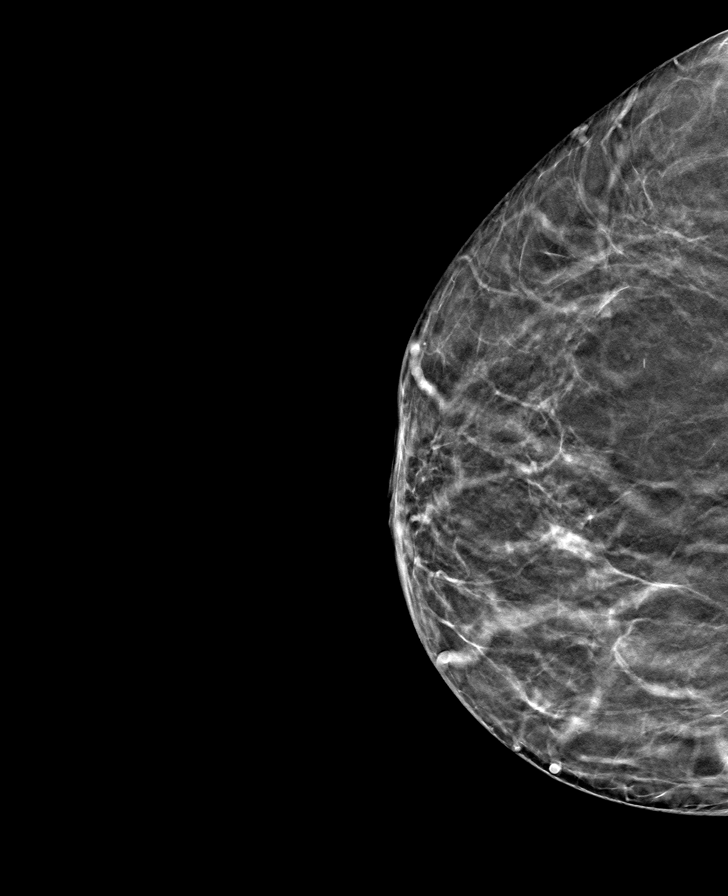

[R MLO tomo · tomo slice 37/72.0]
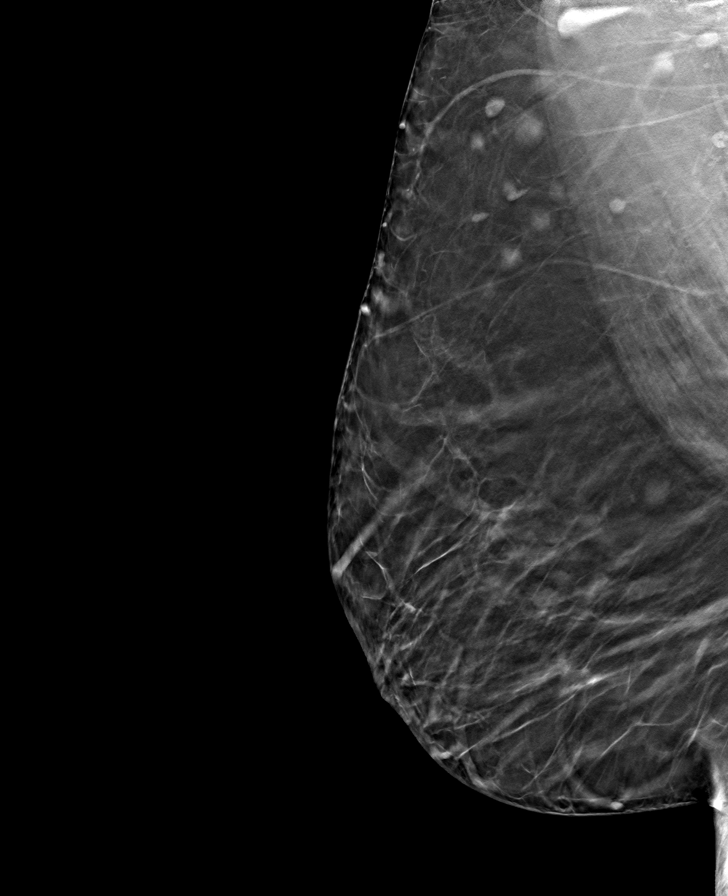

[L CC tomo · tomo slice 39/78.0]
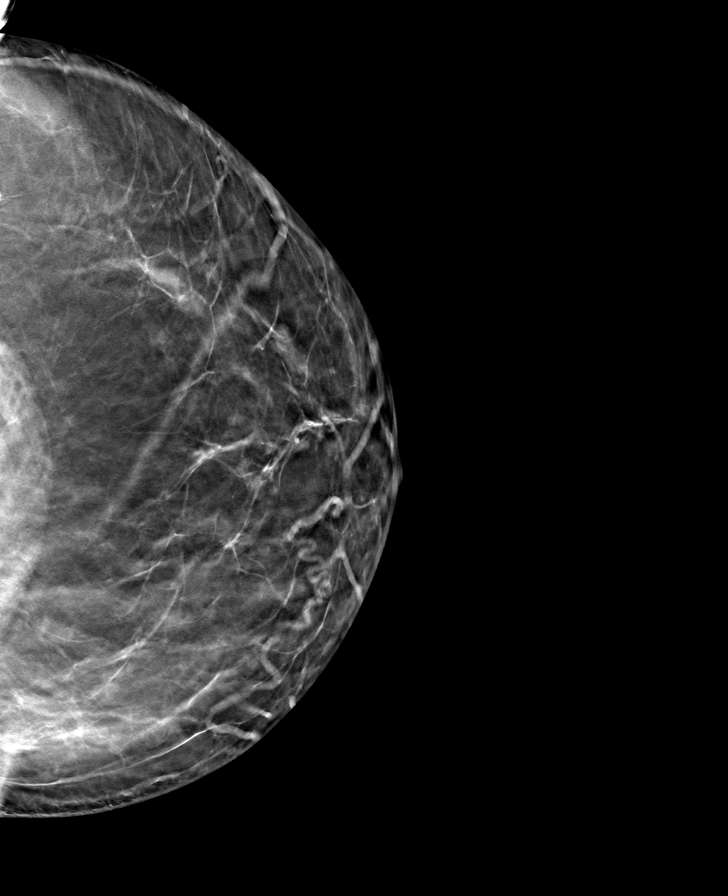

[8 of 24 positions shown; findings below may reference images not displayed]

ACR Breast Density Category b: There are scattered areas of
fibroglandular density.
FINDINGS: There are no findings suspicious for malignancy.
IMPRESSION: No mammographic evidence of malignancy. A result letter of this
screening mammogram will be mailed directly to the patient.

RECOMMENDATION:
Screening mammogram in one year. (Code:51-O-LD2)

BI-RADS CATEGORY  1: Negative.

## 2023-10-13 IMAGING — DX DG SHOULDER 2+V*L*
3 series · 3 of 3 positions shown · non-contrast
Comparison: None.

CLINICAL DATA: Left shoulder pain

EXAM:
LEFT SHOULDER - 2+ VIEW

[shoulder grashey]
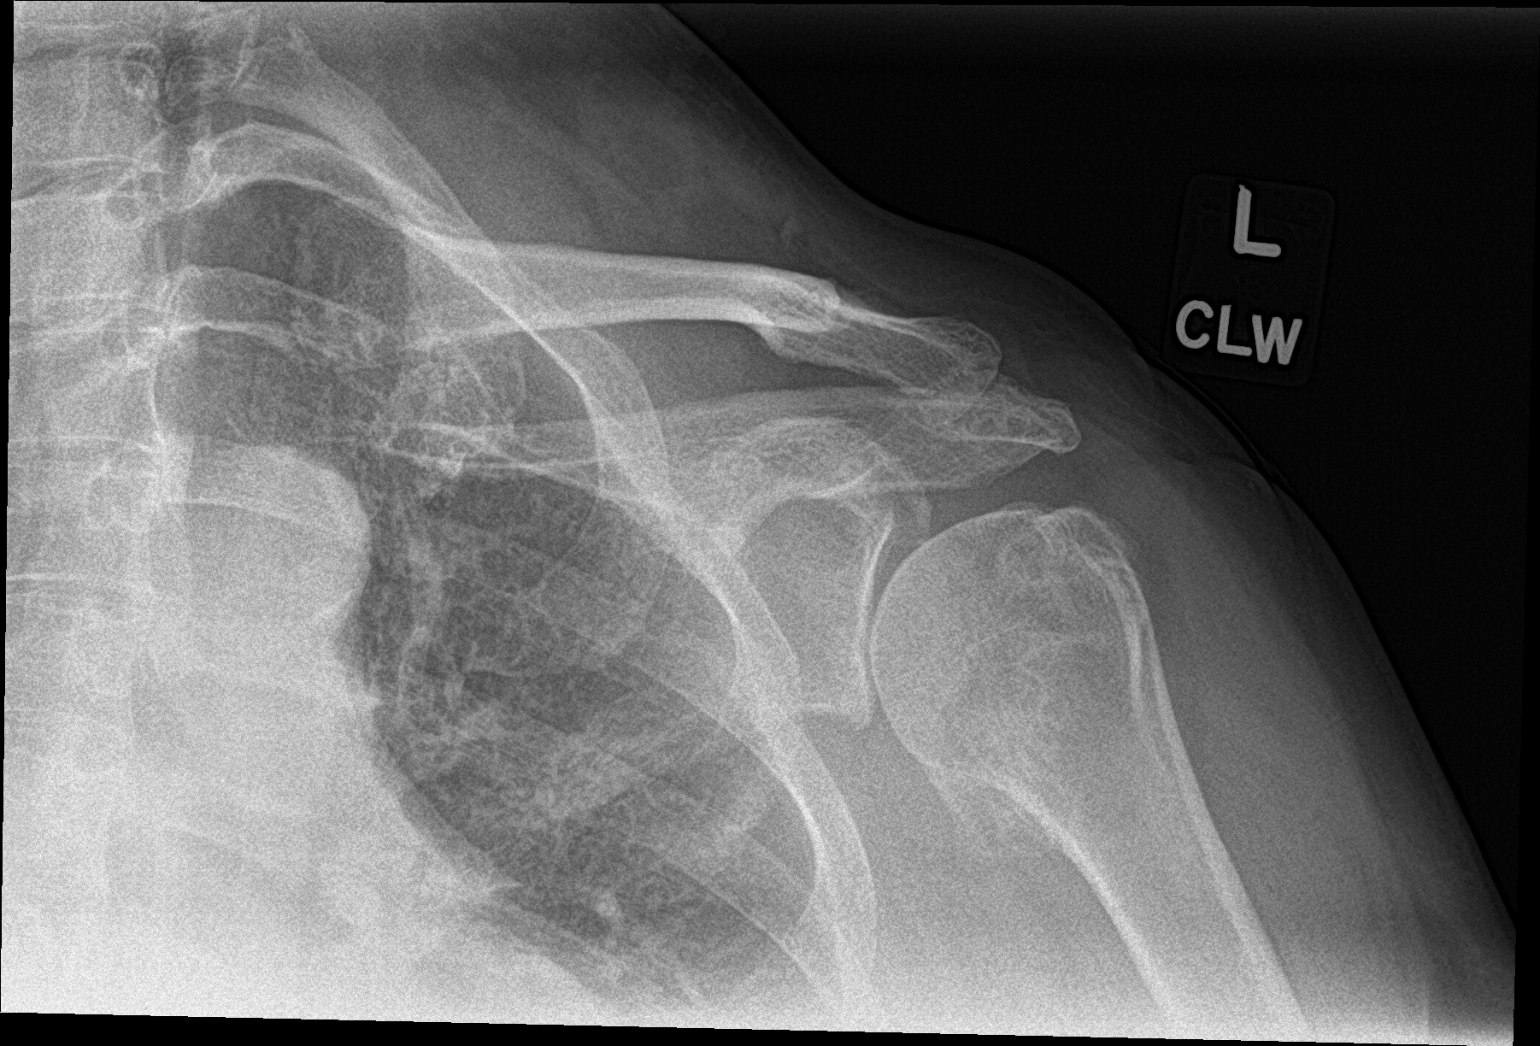

[shoulder y view]
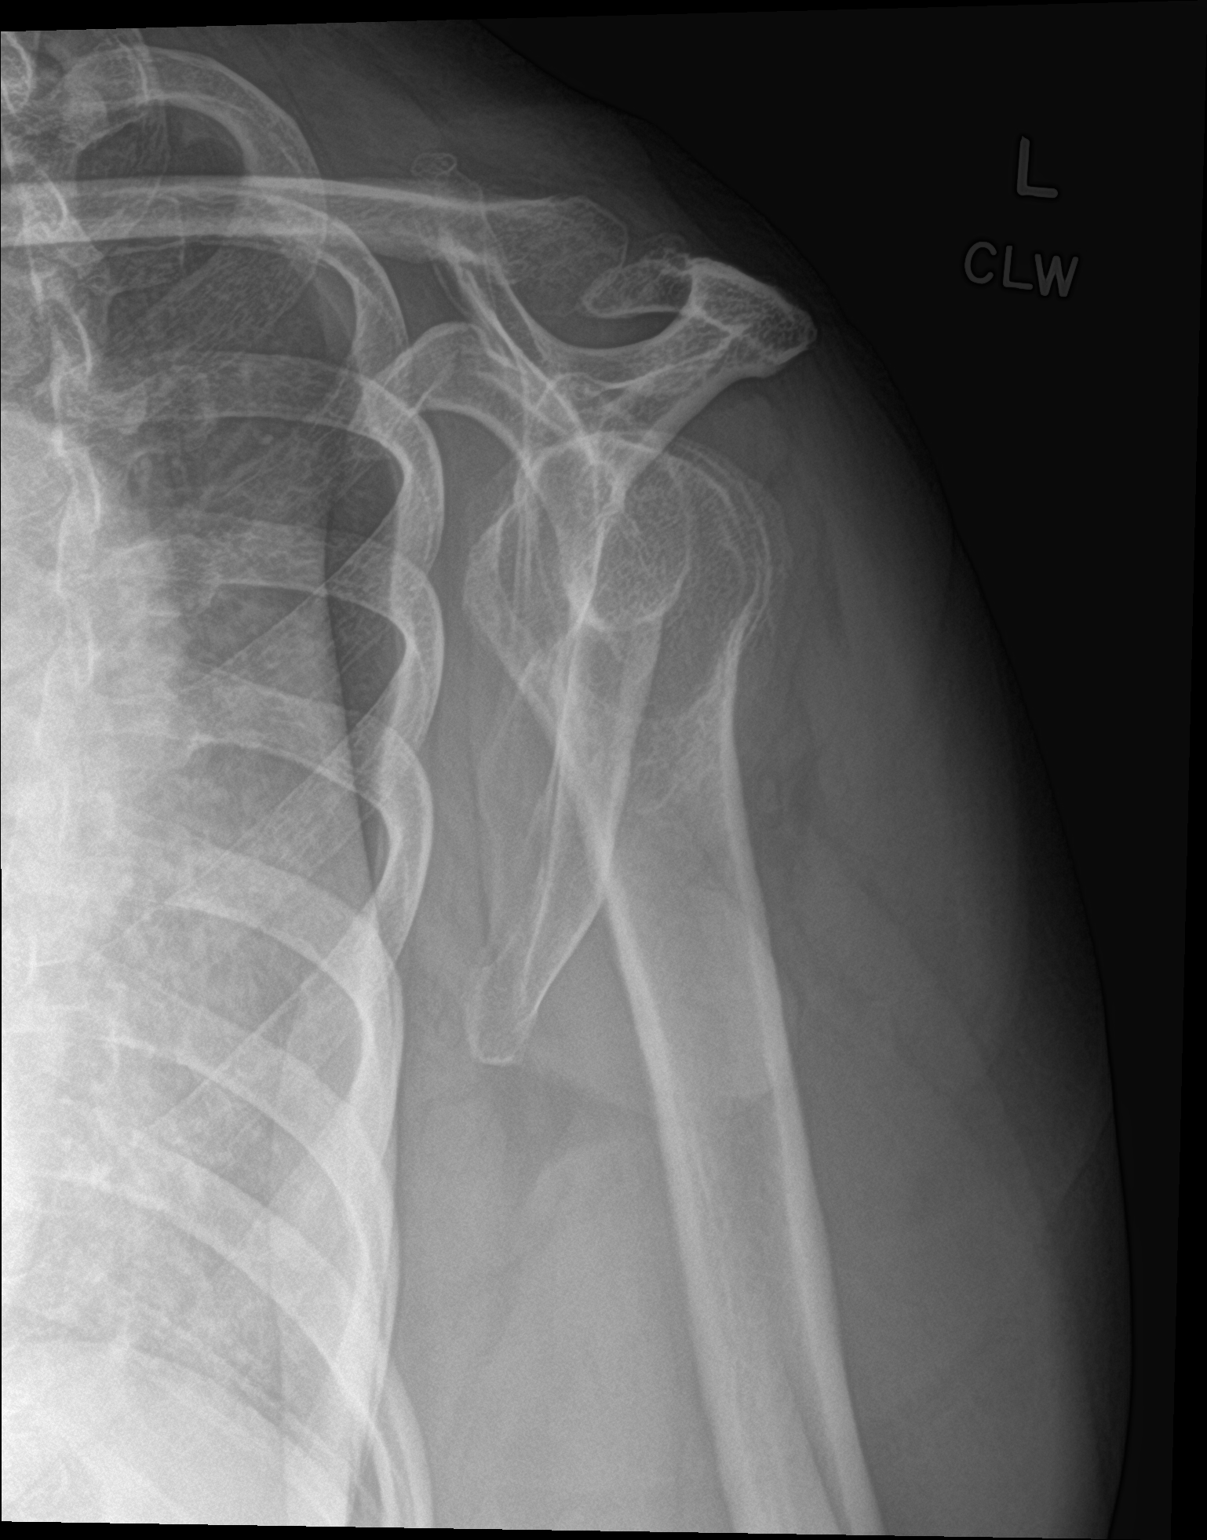

[shoulder ap neutral]
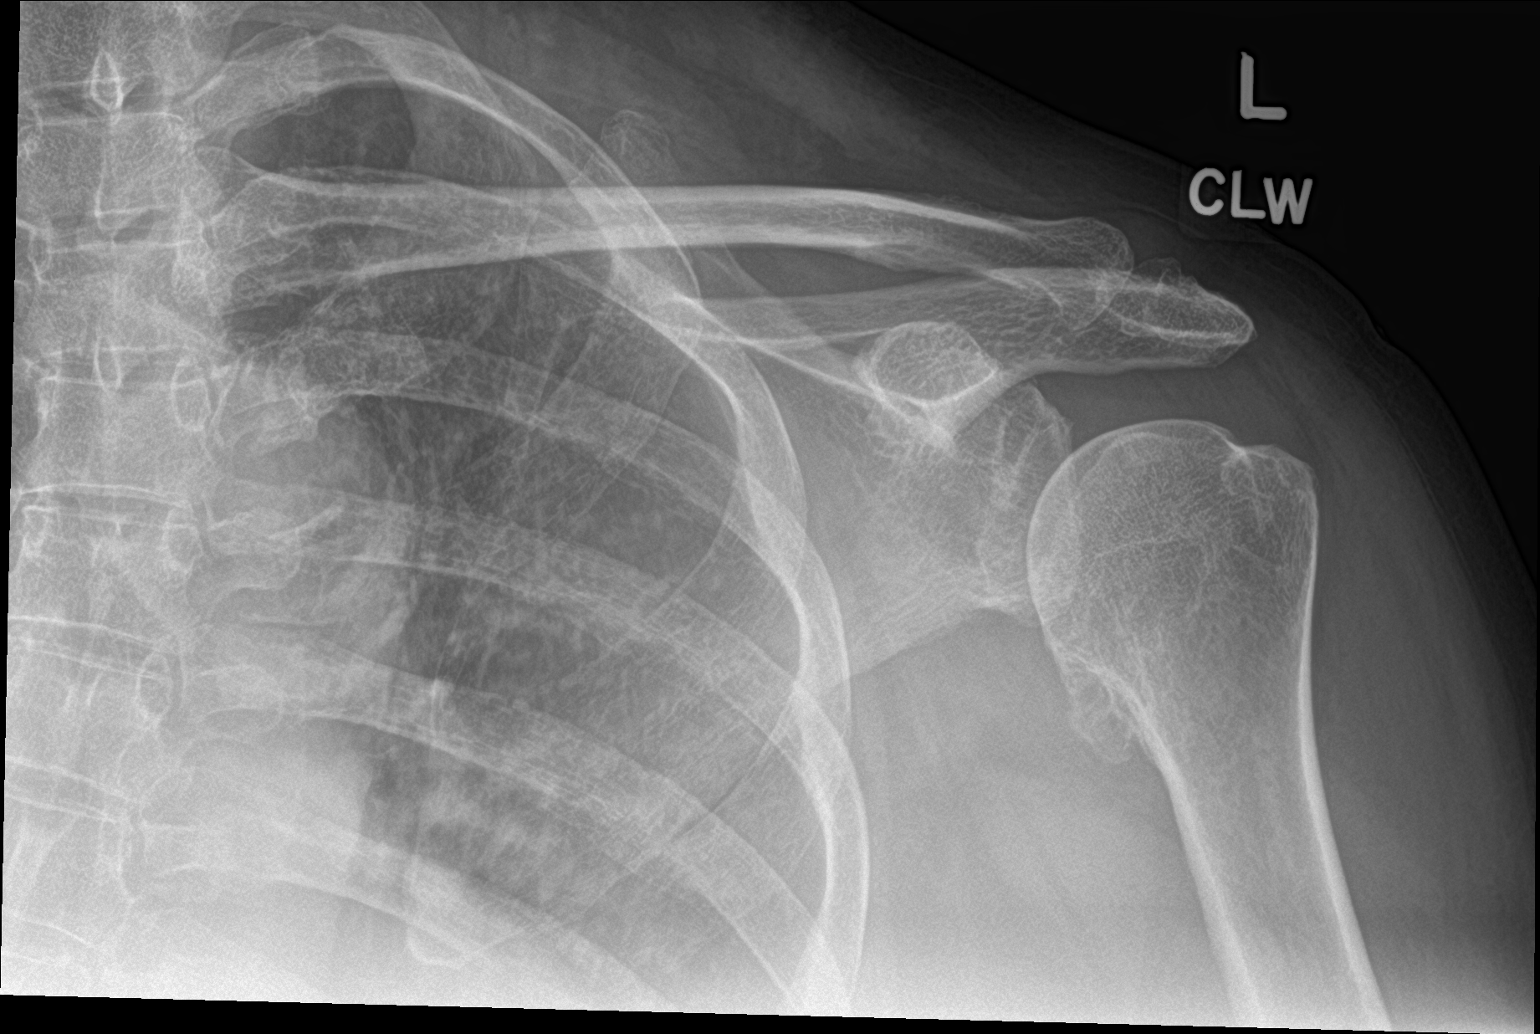

[3 of 3 positions shown; findings below may reference images not displayed]

FINDINGS: No acute fracture or dislocation. Moderate to severe glenohumeral
joint osteoarthritis with joint space loss and bulky marginal
osteophytes. Mild-moderate AC joint arthropathy. No soft tissue
abnormality.
IMPRESSION: Moderate to severe glenohumeral joint osteoarthritis.
